# Patient Record
Sex: Female | Born: 1975 | Hispanic: Yes | Marital: Married | Smoking: Never smoker
Health system: Southern US, Community
[De-identification: ages and names within clinical notes are randomized; demographics above are authoritative.]

## PROBLEM LIST (undated history)

## (undated) DIAGNOSIS — R7303 Prediabetes: Secondary | ICD-10-CM

## (undated) DIAGNOSIS — N12 Tubulo-interstitial nephritis, not specified as acute or chronic: Secondary | ICD-10-CM

## (undated) DIAGNOSIS — R569 Unspecified convulsions: Secondary | ICD-10-CM

## (undated) DIAGNOSIS — E039 Hypothyroidism, unspecified: Secondary | ICD-10-CM

## (undated) DIAGNOSIS — Z87442 Personal history of urinary calculi: Secondary | ICD-10-CM

## (undated) DIAGNOSIS — E079 Disorder of thyroid, unspecified: Secondary | ICD-10-CM

## (undated) HISTORY — PX: CARDIAC CATHETERIZATION: SHX172

---

## 2007-06-13 ENCOUNTER — Inpatient Hospital Stay: Payer: Self-pay | Admitting: Obstetrics and Gynecology

## 2012-09-26 DIAGNOSIS — G40309 Generalized idiopathic epilepsy and epileptic syndromes, not intractable, without status epilepticus: Secondary | ICD-10-CM

## 2012-09-26 HISTORY — DX: Generalized idiopathic epilepsy and epileptic syndromes, not intractable, without status epilepticus: G40.309

## 2014-10-27 DIAGNOSIS — N879 Dysplasia of cervix uteri, unspecified: Secondary | ICD-10-CM | POA: Insufficient documentation

## 2014-10-27 HISTORY — DX: Dysplasia of cervix uteri, unspecified: N87.9

## 2017-08-30 ENCOUNTER — Encounter: Payer: Self-pay | Admitting: Emergency Medicine

## 2017-08-30 ENCOUNTER — Emergency Department: Payer: Self-pay

## 2017-08-30 ENCOUNTER — Emergency Department
Admission: EM | Admit: 2017-08-30 | Discharge: 2017-08-30 | Disposition: A | Discharge status: Home / Self Care | Payer: Self-pay | Attending: Emergency Medicine | Admitting: Emergency Medicine

## 2017-08-30 ENCOUNTER — Other Ambulatory Visit: Payer: Self-pay

## 2017-08-30 DIAGNOSIS — N200 Calculus of kidney: Secondary | ICD-10-CM | POA: Insufficient documentation

## 2017-08-30 DIAGNOSIS — M549 Dorsalgia, unspecified: Secondary | ICD-10-CM | POA: Insufficient documentation

## 2017-08-30 HISTORY — DX: Disorder of thyroid, unspecified: E07.9

## 2017-08-30 LAB — URINALYSIS, ROUTINE W REFLEX MICROSCOPIC
Bilirubin Urine: NEGATIVE
Glucose, UA: NEGATIVE mg/dL
Ketones, ur: NEGATIVE mg/dL
NITRITE: NEGATIVE
Protein, ur: NEGATIVE mg/dL
SPECIFIC GRAVITY, URINE: 1.003 — AB (ref 1.005–1.030)
pH: 6 (ref 5.0–8.0)

## 2017-08-30 LAB — CHLAMYDIA/NGC RT PCR (ARMC ONLY)
CHLAMYDIA TR: NOT DETECTED
N GONORRHOEAE: NOT DETECTED

## 2017-08-30 LAB — WET PREP, GENITAL
Clue Cells Wet Prep HPF POC: NONE SEEN
Sperm: NONE SEEN
Trich, Wet Prep: NONE SEEN
Yeast Wet Prep HPF POC: NONE SEEN

## 2017-08-30 LAB — CBC
HEMATOCRIT: 40.3 % (ref 35.0–47.0)
Hemoglobin: 13.6 g/dL (ref 12.0–16.0)
MCH: 30.1 pg (ref 26.0–34.0)
MCHC: 33.7 g/dL (ref 32.0–36.0)
MCV: 89.1 fL (ref 80.0–100.0)
Platelets: 306 10*3/uL (ref 150–440)
RBC: 4.52 MIL/uL (ref 3.80–5.20)
RDW: 13.1 % (ref 11.5–14.5)
WBC: 5.9 10*3/uL (ref 3.6–11.0)

## 2017-08-30 LAB — COMPREHENSIVE METABOLIC PANEL
ALT: 19 U/L (ref 14–54)
AST: 19 U/L (ref 15–41)
Albumin: 4.4 g/dL (ref 3.5–5.0)
Alkaline Phosphatase: 84 U/L (ref 38–126)
Anion gap: 8 (ref 5–15)
BILIRUBIN TOTAL: 0.5 mg/dL (ref 0.3–1.2)
BUN: 12 mg/dL (ref 6–20)
CO2: 24 mmol/L (ref 22–32)
CREATININE: 0.5 mg/dL (ref 0.44–1.00)
Calcium: 8.9 mg/dL (ref 8.9–10.3)
Chloride: 106 mmol/L (ref 101–111)
Glucose, Bld: 82 mg/dL (ref 65–99)
POTASSIUM: 3.6 mmol/L (ref 3.5–5.1)
Sodium: 138 mmol/L (ref 135–145)
TOTAL PROTEIN: 7.8 g/dL (ref 6.5–8.1)

## 2017-08-30 LAB — PREGNANCY, URINE: PREG TEST UR: NEGATIVE

## 2017-08-30 LAB — LIPASE, BLOOD: LIPASE: 37 U/L (ref 11–51)

## 2017-08-30 MED ORDER — IBUPROFEN 600 MG PO TABS
600.0000 mg | ORAL_TABLET | ORAL | Status: AC
  Administered 2017-08-30: 600 mg via ORAL
  Filled 2017-08-30 (×2): qty 1

## 2017-08-30 MED ORDER — ACETAMINOPHEN 500 MG PO TABS
1000.0000 mg | ORAL_TABLET | ORAL | Status: AC
  Administered 2017-08-30: 1000 mg via ORAL

## 2017-08-30 MED ORDER — ONDANSETRON 4 MG PO TBDP: 4.0000 mg | ORAL_TABLET | Freq: Four times a day (QID) | ORAL | 0 refills | Status: DC | PRN

## 2017-08-30 MED ORDER — ACETAMINOPHEN 500 MG PO TABS
ORAL_TABLET | ORAL | Status: AC
  Filled 2017-08-30: qty 2

## 2017-08-30 NOTE — ED Notes (Signed)
Patient transported to CT 

## 2017-08-30 NOTE — ED Provider Notes (Signed)
[facility] Emergency Department Provider Note   ____________________________________________   First MD Initiated Contact with Patient 08/30/17 1443     (approximate)  I have reviewed the triage vital signs and the nursing notes.   HISTORY  Chief Complaint Abdominal Pain and Back Pain  Spanish interpreter, Maryjane Hurter  HPI Jasmine Henderson is a 42 y.o. female presents for evaluation of back pain and left lower abdominal pain  Patient reports since about Tuesday she is been experiencing discomfort, potentially a week or more.  Reports she has been having a feeling of discomfort in the left lower abdomen and left back.  She went to her primary doctor and was placed on antibiotic, believed Bactrim.  She then went to urgent care a few days later and was treated with Cipro, diagnosed with a "UTI".  She returns again with ongoing symptoms which she describes as a discomfort in her left lower abdomen.  Somewhat dull, but radiates to her left back.  She does notice that she has some discomfort in the vaginal area.  Denies fevers or chills.  No nausea or vomiting.  Reports the pain is mild, has not taken any pain medication for it.  Denies pregnancy, reports that she has had slightly increased length and her most recent menstrual cycle which started about a week ago, and she has had slight persistence of that.  Reports it is similar in amount of spotting to that of a normal menstrual cycle.  No chest pain or trouble breathing.  No numbness tingling or weakness.  No fever or chills.  Patient also reports that she was possibly diagnosed with a fungal vaginal infection with her doctor last week and took 2 doses of fluconazole with the last dose on Wednesday.  Past Medical History:  Diagnosis Date  . Thyroid disease     There are no active problems to display for this patient.     Prior to Admission medications   Medication Sig Start Date End Date Taking?  Authorizing Provider  ondansetron (ZOFRAN ODT) 4 MG disintegrating tablet Take 1 tablet (4 mg total) by mouth every 6 (six) hours as needed for nausea or vomiting. 08/30/17   Sharyn Creamer, MD    Allergies Patient has no known allergies.  No family history on file.  Social History Social History   Tobacco Use  . Smoking status: Never Smoker  . Smokeless tobacco: Never Used  Substance Use Topics  . Alcohol use: No    Frequency: Never  . Drug use: Not on file  She does not smoke drink or use drugs  Review of Systems Constitutional: No fever/chills Eyes: No visual changes. ENT: No sore throat. Cardiovascular: Denies chest pain. Respiratory: Denies shortness of breath. Gastrointestinal: See HPI no nausea, no vomiting.  No diarrhea.  No constipation. Genitourinary: Negative for dysuria.  Does report discomfort in the vaginal region but reports this is a chronic discomfort is been ongoing for years as well. Musculoskeletal: Negative for back pain. Skin: Negative for rash. Neurological: Negative for headaches, focal weakness or numbness.    ____________________________________________   PHYSICAL EXAM:  VITAL SIGNS: ED Triage Vitals  Enc Vitals Group     BP 08/30/17 1143 135/84     Pulse Rate 08/30/17 1143 89     Resp --      Temp 08/30/17 1143 98.4 F (36.9 C)     Temp Source 08/30/17 1143 Oral     SpO2 08/30/17 1143 98 %  Weight 08/30/17 1138 155 lb (70.3 kg)     Height 08/30/17 1138 5\' 2"  (1.575 m)     Head Circumference --      Peak Flow --      Pain Score 08/30/17 1144 8     Pain Loc --      Pain Edu? --      Excl. in GC? --     Constitutional: Alert and oriented. Well appearing and in no acute distress. Eyes: Conjunctivae are normal. Head: Atraumatic. Nose: No congestion/rhinnorhea. Mouth/Throat: Mucous membranes are moist. Neck: No stridor.   Cardiovascular: Normal rate, regular rhythm. Grossly normal heart sounds.  Good peripheral  circulation. Respiratory: Normal respiratory effort.  No retractions. Lungs CTAB. Gastrointestinal: Soft and nontender except for mild tenderness reported to deep palpation in the left lower quadrant without rebound or guarding. No distention.  No CVA tenderness bilateral. GU: Form with tech Tammy as well as Spanish interpreter.  External exam normal.  The right adnexa and left adnexa are nontender.  No pain to cervical motion tenderness.  There is a scant whitish discharge noticed within the vagina.  There is no erythema or redness.  No reproducible pain.  No adnexal masses or tenderness. Musculoskeletal: No lower extremity tenderness nor edema. Neurologic:  Normal speech and language. No gross focal neurologic deficits are appreciated.  Skin:  Skin is warm, dry and intact. No rash noted. Psychiatric: Mood and affect are normal. Speech and behavior are normal.  ____________________________________________   LABS (all labs ordered are listed, but only abnormal results are displayed)  Labs Reviewed  WET PREP, GENITAL - Abnormal; Notable for the following components:      Result Value   WBC, Wet Prep HPF POC RARE (*)    All other components within normal limits  URINALYSIS, ROUTINE W REFLEX MICROSCOPIC - Abnormal; Notable for the following components:   Color, Urine STRAW (*)    APPearance HAZY (*)    Specific Gravity, Urine 1.003 (*)    Hgb urine dipstick LARGE (*)    Leukocytes, UA TRACE (*)    Bacteria, UA FEW (*)    Squamous Epithelial / LPF 0-5 (*)    All other components within normal limits  URINE CULTURE  CHLAMYDIA/NGC RT PCR ([facility] ONLY)  CBC  COMPREHENSIVE METABOLIC PANEL  LIPASE, BLOOD  PREGNANCY, URINE   ____________________________________________  EKG   ____________________________________________  RADIOLOGY CT abdomen results reviewed by me 1. 6 x 2 mm left UVJ stone causing mild left-sided hydroureteronephrosis. 2. 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 ____________________________________________   PROCEDURES  Procedure(s) performed: None  Procedures  Critical Care performed: No  ____________________________________________   INITIAL IMPRESSION / ASSESSMENT AND PLAN / ED COURSE  Pertinent labs & imaging results that were available during my care of the patient were reviewed by me and considered in my medical decision making (see chart for details).      Review of previous records, patient had a urine culture on 08/29/2017 that was negative.  Urinalysis done in clinic today does show moderate bacteria but also moderate squamous cells.  Negative for nitrites, small leukocytes.  Not a clear-cut urinary tract infection at this point.  Our evaluation does show white cells, large hemoglobin, though the patient is also reporting her menstrual cycle.  Differential diagnosis is somewhat broad, will check urine pregnancy test, also consider etiology such as kidney stone, renal or urologic etiology, potentially pelvic etiology such as an ovarian cyst rupture, symptoms do not seem  to be consistent with a torsion.  Reassuring examination without peritonitis, and her symptoms have persisted for about a week's time now making torsion seem highly unlikely.  Pelvic exam no acute findings in the slight whitish discharge without abnormal odor or discomfort.  ----------------------------------------- 4:08 PM on 08/30/2017 -----------------------------------------  CT scan reveals left-sided kidney stone, upper margin of size that she may be able to pass on her own.  She demonstrates no evidence of infection, no elevated white count, no fever, urine culture just a couple days ago negative for infection.  Will reculture urine today, but did not feel that a third antibiotic is needed at this time rather we will culture urine as the patient shows no signs of infection.  Discussed with the patient via interpreter, diagnosis of kidney  stones, need for careful follow-up with her primary doctor and neurologist, careful return precautions including fever, vomiting, weakness, severe pain.  Patient does not wish for anything stronger than Tylenol or ibuprofen for pain, will provide ibuprofen here for her before discharge.  Will give prescription for Zofran in the event of nausea and vomiting.  Return precautions and treatment recommendations and follow-up discussed with the patient who is agreeable with the plan.  Maryjane Hurter, Spanish interpreter utilized.   ____________________________________________   FINAL CLINICAL IMPRESSION(S) / ED DIAGNOSES  Final diagnoses:  Kidney stone on left side      NEW MEDICATIONS STARTED DURING THIS VISIT:  New Prescriptions   ONDANSETRON (ZOFRAN ODT) 4 MG DISINTEGRATING TABLET    Take 1 tablet (4 mg total) by mouth every 6 (six) hours as needed for nausea or vomiting.     Note:  This document was prepared using Dragon voice recognition software and may include unintentional dictation errors.     Sharyn Creamer, MD 08/30/17 1610

## 2017-08-30 NOTE — ED Notes (Signed)
Lab notified to add on Urine Pregnancy 

## 2017-08-30 NOTE — ED Notes (Signed)
First Nurse Note:  Patient here from [facility] via [facility].  Seen there for UTI on Tuesday, here today because of no improvement.  Will need Interpreter.

## 2017-08-30 NOTE — Discharge Instructions (Signed)
You have been seen in the Emergency Department (ED) today for pain that we believe based on your workup, is caused by kidney stones.  As we have discussed, please drink plenty of fluids.  Please make a follow up appointment with the physician(s) listed elsewhere in this documentation.  We also recommend that you take over-the-counter ibuprofen regularly according to label instructions over the next 5 days.  Take it with meals to minimize stomach discomfort.  Please see your doctor as soon as possible as stones may take 1-3 weeks to pass and you may require additional care or medications.   Return to the Emergency Department (ED) or call your doctor if you have any worsening pain, fever, painful urination, are unable to urinate, or develop other symptoms that concern you.

## 2017-08-30 NOTE — ED Triage Notes (Signed)
Pt here for lower abd pain and back pain. Pt seen at health dept Friday and given bactrum and diflucan. [facility] and given cipro. Went backto [facility] today because problem is worse and sent here. Has vaginal burning but not urinary burning but has seen blood in urine.

## 2017-08-30 NOTE — ED Notes (Signed)
Pelvic Cart set up at bedside. 

## 2017-08-30 NOTE — ED Notes (Signed)
Spoke with pt about wait times and what to expect next. Advised pt that I am available for further questions if needed.  

## 2017-08-31 LAB — URINE CULTURE
Culture: <10000 — AB
SPECIAL REQUESTS: NORMAL

## 2017-09-02 NOTE — H&P (View-Only) (Signed)
09/03/2017 12:40 PM   Jasmine Henderson 06/18/1975 [id number]  Referring provider: [facility] [address] [place], [place] [id number]  Chief Complaint  Patient presents with  . Nephrolithiasis    HPI: Patient is a 42 year old Hispanic female who presents today for follow up after [facility] nephrolithiasis with interpreter, Orson SlickJacqui.    She presented to ED on 08/30/2017 for abdominal pain.  CT Renal stone study performed on 08/30/2017 noted a 6 x 2 mm left UVJ stone causing mild left-sided hydroureteronephrosis.  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  Her creatinine was 0.50.  Her WBC count was 5.9.  Her UA was positive for 0-5 RBC's and 6-30 WBC's.  Urine culture with insignificant growth.    Today, she is experiencing left lower quadrant pain that is intermittent in nature.  She describes it as a burning sensation.  It has been a 4 out of 10 pain.  She has had associated frequency, nocturia and gross hematuria.  She is also been experiencing some right flank pain for the last week.  It is also been a 4 out of 10 pain.  She describes the pain as a dull ache.  She states this pain is constant.  She states Tylenol helps the pain.  She states movement makes the right flank pain worse.  Patient denies any gross hematuria, dysuria or suprapubic/flank pain.  Patient denies any fevers, chills, nausea or vomiting.   Her UA is positive for 6-10 WBC's, 11-30 RBC's and moderate bacteria.    PMH: Past Medical History:  Diagnosis Date  . Thyroid disease     Surgical History: None  Home Medications:  Allergies as of 09/03/2017   No Known Allergies     Medication List        Accurate as of 09/03/17 12:40 PM. Always use your most recent med list.          levothyroxine 100 MCG tablet Commonly known as:  SYNTHROID, LEVOTHROID Take by mouth.       Allergies: No Known Allergies  Family History: Family  History  Problem Relation Age of Onset  . Bladder Cancer Neg Hx   . Kidney cancer Neg Hx     Social History:  reports that  has never smoked. she has never used smokeless tobacco. She reports that she does not drink alcohol or use drugs.  ROS: UROLOGY Frequent Urination?: Yes Hard to postpone urination?: No Burning/pain with urination?: No Get up at night to urinate?: Yes Leakage of urine?: No Urine stream starts and stops?: No Trouble starting stream?: No Do you have to strain to urinate?: No Blood in urine?: Yes Urinary tract infection?: Yes Sexually transmitted disease?: No Injury to kidneys or bladder?: No Painful intercourse?: No Weak stream?: No Currently pregnant?: No Vaginal bleeding?: No Last menstrual period?: 08/07/2017  Gastrointestinal Nausea?: No Vomiting?: No Indigestion/heartburn?: No Diarrhea?: No Constipation?: No  Constitutional Fever: No Night sweats?: No Weight loss?: No Fatigue?: Yes  Skin Skin rash/lesions?: Yes Itching?: Yes  Eyes Blurred vision?: Yes Double vision?: No  Ears/Nose/Throat Sore throat?: No Sinus problems?: No  Hematologic/Lymphatic Swollen glands?: No Easy bruising?: No  Cardiovascular Leg swelling?: No Chest pain?: No  Respiratory Cough?: No Shortness of breath?: No  Endocrine Excessive thirst?: No  Musculoskeletal Back pain?: Yes Joint pain?: No  Neurological Headaches?: No Dizziness?: No  Psychologic Depression?: No Anxiety?: No  Physical Exam: BP 131/85   Pulse  77   Resp 16   Ht 5\' 2"  (1.575 m)   Wt 156 lb 3.2 oz (70.9 kg)   LMP 08/07/2017 Comment: neg preg test today  SpO2 98%   BMI 28.57 kg/m   Constitutional: Well nourished. Alert and oriented, No acute distress. HEENT: [place] AT, moist mucus membranes. Trachea midline, no masses. Cardiovascular: No clubbing, cyanosis, or edema. Respiratory: Normal respiratory effort, no increased work of breathing. GI: Abdomen is soft, non tender,  non distended, no abdominal masses. Liver and spleen not palpable.  No hernias appreciated.  Stool sample for occult testing is not indicated.   GU: No CVA tenderness.  No bladder fullness or masses.   Skin: No rashes, bruises or suspicious lesions. Lymph: No cervical or inguinal adenopathy. Neurologic: Grossly intact, no focal deficits, moving all 4 extremities. Psychiatric: Normal mood and affect.  Laboratory Data: Lab Results  Component Value Date   WBC 5.9 08/30/2017   HGB 13.6 08/30/2017   HCT 40.3 08/30/2017   MCV 89.1 08/30/2017   PLT 306 08/30/2017    Lab Results  Component Value Date   CREATININE 0.50 08/30/2017    No results found for: PSA  No results found for: TESTOSTERONE  No results found for: HGBA1C  No results found for: TSH  No results found for: CHOL, HDL, CHOLHDL, VLDL, LDLCALC  Lab Results  Component Value Date   AST 19 08/30/2017   Lab Results  Component Value Date   ALT 19 08/30/2017   No components found for: ALKALINEPHOPHATASE No components found for: BILIRUBINTOTAL  No results found for: ESTRADIOL  Urinalysis 6-10 WBC's.  11-30 RBC's.  Many bacteria.  See Epic.   I have reviewed the labs.   Pertinent Imaging: CLINICAL DATA:  Lower back and abdominal pain x2 weeks.  EXAM: CT ABDOMEN AND PELVIS WITHOUT CONTRAST  TECHNIQUE: Multidetector CT imaging of the abdomen and pelvis was performed following the standard protocol without IV contrast.  COMPARISON:  None.  FINDINGS: Lower chest: Normal size heart. Minimal dependent atelectasis at the lung bases. No acute abnormality.  Hepatobiliary: No focal liver abnormality is seen. No gallstones, gallbladder wall thickening, or biliary dilatation.  Pancreas: Normal  Spleen: Normal  Adrenals/Urinary Tract: Normal bilateral adrenal glands. Large staghorn calculus occupying the intrarenal collecting system and right renal pelvis. No distal uropathy is noted. A 6 x 2 mm left  UVJ stone is identified causing mild left-sided hydroureteronephrosis. The urinary bladder is decompressed which may account for the slightly thick-walled appearance.  Stomach/Bowel: Stomach is within normal limits. Appendix appears normal. No evidence of bowel wall thickening, distention, or inflammatory changes.  Vascular/Lymphatic: No significant vascular findings are present. No enlarged abdominal or pelvic lymph nodes.  Reproductive: Uterus and bilateral adnexa are unremarkable.  Other: No abdominal wall hernia or abnormality. No abdominopelvic ascites.  Musculoskeletal: No acute or significant osseous findings.  IMPRESSION: 1. 6 x 2 mm left UVJ stone causing mild left-sided hydroureteronephrosis. 2. 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   Electronically Signed   By: Tollie Eth M.D.   On: 08/30/2017 15:50 I have independently reviewed the films with Dr. Apolinar Junes.   Assessment & Plan:    Patient with a thickened wall of the bladder associated with a left UVJ stone contained within a ureterocele and hydronephrosis will be undergoing cystoscopy, ureteroscopy with laser lithotripsy and ureteral stent placement for definitive management.  The right staghorn kidney stone will be addressed once she has recovered from the left-sided procedure.  1. Left ureteral stone  - left ureteral stone contained in an ureterocele  - schedule left ureteroscopy with laser lithotripsy and ureteral stent placement  - explained to the patient how the procedure is performed and the risks involved  - informed patient that they will have a stent placed during the procedure and will remain in place after the procedure for a short time.   - stent may be removed in the office with a cystoscope or patient may be instructed to remove the stent themselves by the string  - described "stent pain" as feelings of needing to urinate/overactive bladder and a warm, tingling sensation to  intense pain in the affected flank  - residual stones within the kidney or ureter may be present after the procedure and may need to have these addressed at a different encounter  - injury to the ureter is the most common intra-operative risk, it may result in an open procedure to correct the defect  - infection and bleeding are also risks  - explained the risks of general anesthesia, such as: MI, CVA, paralysis, coma and/or death.  - advised to contact our office or seek treatment in the ED if becomes febrile or pain/ vomiting are difficult control in order to arrange for emergent/urgent intervention  2. Left hydronephrosis  - obtain RUS to ensure the hydronephrosis has resolved once the left ureteral stone has been treated  3. Thicken bladder wall Scheduled for cystoscopy at the time of left URS/LL/ureteral stent placement  4. Left ureterocele Scheduled for left URS/LL/ureteral stent placement  4. Microscopic hematuria  - UA today demonstrates 11-30 RBC's  - continue to monitor the patient's UA after the treatment/passage of the stone to ensure the hematuria has resolved  - if hematuria persists, we will pursue a hematuria workup with CT Urogram and cystoscopy if appropriate.  5. Right staghorn calculus Will need to be addressed once recovered from left ureteral procedure - explained to the patient that this will require staged procedures  Return for cystoscopy with left URS/LL/ureteral stent placement.  These notes generated with voice recognition software. I apologize for typographical errors.  Michiel Cowboy, PA-C  [facility] [address] [place], [place] [address] [phone]

## 2017-09-02 NOTE — Progress Notes (Signed)
09/03/2017 12:40 PM   Jasmine Henderson 06/18/1975 [id number]  Referring provider: [facility] [address] [place], [place] [address]  Chief Complaint  Patient presents with  . Nephrolithiasis    HPI: Patient is a 42 year old Hispanic female who presents today for follow up after being [facility] for nephrolithiasis with interpreter, Orson SlickJacqui.    She presented to ED on 08/30/2017 for abdominal pain.  CT Renal stone study performed on 08/30/2017 noted a 6 x 2 mm left UVJ stone causing mild left-sided hydroureteronephrosis.  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  Her creatinine was 0.50.  Her WBC count was 5.9.  Her UA was positive for 0-5 RBC's and 6-30 WBC's.  Urine culture with insignificant growth.    Today, she is experiencing left lower quadrant pain that is intermittent in nature.  She describes it as a burning sensation.  It has been a 4 out of 10 pain.  She has had associated frequency, nocturia and gross hematuria.  She is also been experiencing some right flank pain for the last week.  It is also been a 4 out of 10 pain.  She describes the pain as a dull ache.  She states this pain is constant.  She states Tylenol helps the pain.  She states movement makes the right flank pain worse.  Patient denies any gross hematuria, dysuria or suprapubic/flank pain.  Patient denies any fevers, chills, nausea or vomiting.   Her UA is positive for 6-10 WBC's, 11-30 RBC's and moderate bacteria.    PMH: Past Medical History:  Diagnosis Date  . Thyroid disease     Surgical History: None  Home Medications:  Allergies as of 09/03/2017   No Known Allergies     Medication List        Accurate as of 09/03/17 12:40 PM. Always use your most recent med list.          levothyroxine 100 MCG tablet Commonly known as:  SYNTHROID, LEVOTHROID Take by mouth.       Allergies: No Known Allergies  Family History: Family  History  Problem Relation Age of Onset  . Bladder Cancer Neg Hx   . Kidney cancer Neg Hx     Social History:  reports that  has never smoked. she has never used smokeless tobacco. She reports that she does not drink alcohol or use drugs.  ROS: UROLOGY Frequent Urination?: Yes Hard to postpone urination?: No Burning/pain with urination?: No Get up at night to urinate?: Yes Leakage of urine?: No Urine stream starts and stops?: No Trouble starting stream?: No Do you have to strain to urinate?: No Blood in urine?: Yes Urinary tract infection?: Yes Sexually transmitted disease?: No Injury to kidneys or bladder?: No Painful intercourse?: No Weak stream?: No Currently pregnant?: No Vaginal bleeding?: No Last menstrual period?: 08/07/2017  Gastrointestinal Nausea?: No Vomiting?: No Indigestion/heartburn?: No Diarrhea?: No Constipation?: No  Constitutional Fever: No Night sweats?: No Weight loss?: No Fatigue?: Yes  Skin Skin rash/lesions?: Yes Itching?: Yes  Eyes Blurred vision?: Yes Double vision?: No  Ears/Nose/Throat Sore throat?: No Sinus problems?: No  Hematologic/Lymphatic Swollen glands?: No Easy bruising?: No  Cardiovascular Leg swelling?: No Chest pain?: No  Respiratory Cough?: No Shortness of breath?: No  Endocrine Excessive thirst?: No  Musculoskeletal Back pain?: Yes Joint pain?: No  Neurological Headaches?: No Dizziness?: No  Psychologic Depression?: No Anxiety?: No  Physical Exam: BP 131/85   Pulse  77   Resp 16   Ht 5\' 2"  (1.575 m)   Wt 156 lb 3.2 oz (70.9 kg)   LMP 08/07/2017 Comment: neg preg test today  SpO2 98%   BMI 28.57 kg/m   Constitutional: Well nourished. Alert and oriented, No acute distress. HEENT: [place] AT, moist mucus membranes. Trachea midline, no masses. Cardiovascular: No clubbing, cyanosis, or edema. Respiratory: Normal respiratory effort, no increased work of breathing. GI: Abdomen is soft, non tender,  non distended, no abdominal masses. Liver and spleen not palpable.  No hernias appreciated.  Stool sample for occult testing is not indicated.   GU: No CVA tenderness.  No bladder fullness or masses.   Skin: No rashes, bruises or suspicious lesions. Lymph: No cervical or inguinal adenopathy. Neurologic: Grossly intact, no focal deficits, moving all 4 extremities. Psychiatric: Normal mood and affect.  Laboratory Data: Lab Results  Component Value Date   WBC 5.9 08/30/2017   HGB 13.6 08/30/2017   HCT 40.3 08/30/2017   MCV 89.1 08/30/2017   PLT 306 08/30/2017    Lab Results  Component Value Date   CREATININE 0.50 08/30/2017    No results found for: PSA  No results found for: TESTOSTERONE  No results found for: HGBA1C  No results found for: TSH  No results found for: CHOL, HDL, CHOLHDL, VLDL, LDLCALC  Lab Results  Component Value Date   AST 19 08/30/2017   Lab Results  Component Value Date   ALT 19 08/30/2017   No components found for: ALKALINEPHOPHATASE No components found for: BILIRUBINTOTAL  No results found for: ESTRADIOL  Urinalysis 6-10 WBC's.  11-30 RBC's.  Many bacteria.  See Epic.   I have reviewed the labs.   Pertinent Imaging: CLINICAL DATA:  Lower back and abdominal pain x2 weeks.  EXAM: CT ABDOMEN AND PELVIS WITHOUT CONTRAST  TECHNIQUE: Multidetector CT imaging of the abdomen and pelvis was performed following the standard protocol without IV contrast.  COMPARISON:  None.  FINDINGS: Lower chest: Normal size heart. Minimal dependent atelectasis at the lung bases. No acute abnormality.  Hepatobiliary: No focal liver abnormality is seen. No gallstones, gallbladder wall thickening, or biliary dilatation.  Pancreas: Normal  Spleen: Normal  Adrenals/Urinary Tract: Normal bilateral adrenal glands. Large staghorn calculus occupying the intrarenal collecting system and right renal pelvis. No distal uropathy is noted. A 6 x 2 mm left  UVJ stone is identified causing mild left-sided hydroureteronephrosis. The urinary bladder is decompressed which may account for the slightly thick-walled appearance.  Stomach/Bowel: Stomach is within normal limits. Appendix appears normal. No evidence of bowel wall thickening, distention, or inflammatory changes.  Vascular/Lymphatic: No significant vascular findings are present. No enlarged abdominal or pelvic lymph nodes.  Reproductive: Uterus and bilateral adnexa are unremarkable.  Other: No abdominal wall hernia or abnormality. No abdominopelvic ascites.  Musculoskeletal: No acute or significant osseous findings.  IMPRESSION: 1. 6 x 2 mm left UVJ stone causing mild left-sided hydroureteronephrosis. 2. 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   Electronically Signed   By: Tollie Eth M.D.   On: 08/30/2017 15:50 I have independently reviewed the films with Dr. Apolinar Junes.   Assessment & Plan:    Patient with a thickened wall of the bladder associated with a left UVJ stone contained within a ureterocele and hydronephrosis will be undergoing cystoscopy, ureteroscopy with laser lithotripsy and ureteral stent placement for definitive management.  The right staghorn kidney stone will be addressed once she has recovered from the left-sided procedure.  1. Left ureteral stone  - left ureteral stone contained in an ureterocele  - schedule left ureteroscopy with laser lithotripsy and ureteral stent placement  - explained to the patient how the procedure is performed and the risks involved  - informed patient that they will have a stent placed during the procedure and will remain in place after the procedure for a short time.   - stent may be removed in the office with a cystoscope or patient may be instructed to remove the stent themselves by the string  - described "stent pain" as feelings of needing to urinate/overactive bladder and a warm, tingling sensation to  intense pain in the affected flank  - residual stones within the kidney or ureter may be present after the procedure and may need to have these addressed at a different encounter  - injury to the ureter is the most common intra-operative risk, it may result in an open procedure to correct the defect  - infection and bleeding are also risks  - explained the risks of general anesthesia, such as: MI, CVA, paralysis, coma and/or death.  - advised to contact our office or seek treatment in the ED if becomes febrile or pain/ vomiting are difficult control in order to arrange for emergent/urgent intervention  2. Left hydronephrosis  - obtain RUS to ensure the hydronephrosis has resolved once the left ureteral stone has been treated  3. Thicken bladder wall Scheduled for cystoscopy at the time of left URS/LL/ureteral stent placement  4. Left ureterocele Scheduled for left URS/LL/ureteral stent placement  4. Microscopic hematuria  - UA today demonstrates 11-30 RBC's  - continue to monitor the patient's UA after the treatment/passage of the stone to ensure the hematuria has resolved  - if hematuria persists, we will pursue a hematuria workup with CT Urogram and cystoscopy if appropriate.  5. Right staghorn calculus Will need to be addressed once recovered from left ureteral procedure - explained to the patient that this will require staged procedures  Return for cystoscopy with left URS/LL/ureteral stent placement.  These notes generated with voice recognition software. I apologize for typographical errors.  Michiel Cowboy, PA-C  [facility] [address] [place], [place] [address] [phone]

## 2017-09-03 ENCOUNTER — Ambulatory Visit (INDEPENDENT_AMBULATORY_CARE_PROVIDER_SITE_OTHER): Payer: Self-pay | Admitting: Urology

## 2017-09-03 ENCOUNTER — Encounter: Payer: Self-pay | Admitting: Urology

## 2017-09-03 ENCOUNTER — Other Ambulatory Visit: Payer: Self-pay | Admitting: Radiology

## 2017-09-03 VITALS — BP 131/85 | HR 77 | Resp 16 | Ht 62.0 in | Wt 156.2 lb

## 2017-09-03 DIAGNOSIS — N200 Calculus of kidney: Secondary | ICD-10-CM

## 2017-09-03 DIAGNOSIS — N201 Calculus of ureter: Secondary | ICD-10-CM

## 2017-09-03 DIAGNOSIS — N132 Hydronephrosis with renal and ureteral calculous obstruction: Secondary | ICD-10-CM

## 2017-09-03 DIAGNOSIS — E039 Hypothyroidism, unspecified: Secondary | ICD-10-CM | POA: Insufficient documentation

## 2017-09-03 DIAGNOSIS — R3129 Other microscopic hematuria: Secondary | ICD-10-CM

## 2017-09-03 LAB — URINALYSIS, COMPLETE
BILIRUBIN UA: NEGATIVE
GLUCOSE, UA: NEGATIVE
KETONES UA: NEGATIVE
NITRITE UA: NEGATIVE
Protein, UA: NEGATIVE
SPEC GRAV UA: 1.02 (ref 1.005–1.030)
UUROB: 0.2 mg/dL (ref 0.2–1.0)
pH, UA: 7 (ref 5.0–7.5)

## 2017-09-03 LAB — MICROSCOPIC EXAMINATION

## 2017-09-06 LAB — CULTURE, URINE COMPREHENSIVE

## 2017-09-17 ENCOUNTER — Other Ambulatory Visit: Payer: Self-pay

## 2017-09-17 ENCOUNTER — Encounter
Admission: RE | Admit: 2017-09-17 | Discharge: 2017-09-17 | Disposition: A | Discharge status: Home / Self Care | Payer: Self-pay | Source: Ambulatory Visit | Attending: Urology | Admitting: Urology

## 2017-09-17 DIAGNOSIS — Z01818 Encounter for other preprocedural examination: Secondary | ICD-10-CM | POA: Insufficient documentation

## 2017-09-17 DIAGNOSIS — N132 Hydronephrosis with renal and ureteral calculous obstruction: Secondary | ICD-10-CM | POA: Insufficient documentation

## 2017-09-17 HISTORY — DX: Personal history of urinary calculi: Z87.442

## 2017-09-17 HISTORY — DX: Hypothyroidism, unspecified: E03.9

## 2017-09-17 HISTORY — DX: Prediabetes: R73.03

## 2017-09-17 LAB — URINALYSIS, ROUTINE W REFLEX MICROSCOPIC
Bilirubin Urine: NEGATIVE
Glucose, UA: NEGATIVE mg/dL
Ketones, ur: NEGATIVE mg/dL
Nitrite: NEGATIVE
PROTEIN: NEGATIVE mg/dL
Specific Gravity, Urine: 1.004 — ABNORMAL LOW (ref 1.005–1.030)
pH: 6 (ref 5.0–8.0)

## 2017-09-17 LAB — BASIC METABOLIC PANEL
Anion gap: 9 (ref 5–15)
BUN: 11 mg/dL (ref 6–20)
CALCIUM: 9.5 mg/dL (ref 8.9–10.3)
CO2: 28 mmol/L (ref 22–32)
Chloride: 104 mmol/L (ref 101–111)
Creatinine, Ser: 0.55 mg/dL (ref 0.44–1.00)
GFR calc Af Amer: >60 mL/min (ref >60)
GLUCOSE: 95 mg/dL (ref 65–99)
Potassium: 3.4 mmol/L — ABNORMAL LOW (ref 3.5–5.1)
Sodium: 141 mmol/L (ref 135–145)

## 2017-09-17 LAB — CBC
HCT: 42 % (ref 35.0–47.0)
Hemoglobin: 14 g/dL (ref 12.0–16.0)
MCH: 30.1 pg (ref 26.0–34.0)
MCHC: 33.5 g/dL (ref 32.0–36.0)
MCV: 89.9 fL (ref 80.0–100.0)
PLATELETS: 314 10*3/uL (ref 150–440)
RBC: 4.67 MIL/uL (ref 3.80–5.20)
RDW: 13.3 % (ref 11.5–14.5)
WBC: 5.9 10*3/uL (ref 3.6–11.0)

## 2017-09-17 NOTE — Patient Instructions (Addendum)
Your procedure is scheduled on: Tuesday 09/24/17 Su procedimiento est programado para: Report to  Presntese a: To find out your arrival time please call [phone] between 1PM - 3PM on Monday 09/23/17. Para saber su hora de llegada por favor llame al [phone] entre la 1PM - 3PM el da:  Remember: Instructions that are not followed completely may result in serious medical risk, up to and including death, or upon the discretion of your surgeon and anesthesiologist your surgery may need to be rescheduled.  Recuerde: Las instrucciones que no se siguen completamente Armed forces logistics/support/administrative officer en un riesgo de salud grave, incluyendo hasta la [place] o a discrecin de su cirujano y Scientific laboratory technician, su ciruga se puede posponer.   __X__ 1. Do not eat food or drink liquids after midnight. No gum chewing or hard candies.  No coma alimentos ni tome lquidos despus de la medianoche.  No mastique chicle ni caramelos  duros.     __X__ 2. No alcohol for 24 hours before or after surgery.    No tome alcohol durante las 24 horas antes ni despus de la [place].   ____ 3. Bring all medications with you on the day of surgery if instructed.    Lleve todos los medicamentos con usted el da de su ciruga si se le ha indicado as.   __X__ 4. Notify your doctor if there is any change in your medical condition (cold, fever,                             infections).    Informe a su mdico si hay algn cambio en su condicin mdica (resfriado, fiebre, infecciones).   Do not wear jewelry, make-up, hairpins, clips or nail polish.  No use joyas, maquillajes, pinzas/ganchos para el cabello ni esmalte de uas.  Do not wear lotions, powders, or perfumes. .  No use lociones, polvos o perfumes.  .    Do not shave 48 hours prior to surgery. Men may shave face and neck.  No se afeite 48 horas antes de la [place].  Los hombres pueden [employer] cara y el cuello.   Do not bring valuables to the hospital.   No lleve objetos de  valor al hospital.  [facility] is not responsible for any belongings or valuables.  [place] no se hace responsable de ningn tipo de pertenencias u objetos de Licensed conveyancer.               Contacts, dentures or bridgework may not be worn into surgery.  Los lentes de [place], las dentaduras postizas o puentes no se pueden usar en la [place].  Leave your suitcase in the car. After surgery it may be brought to your room.  Deje su maleta en el auto.  Despus de la ciruga podr traerla a su habitacin.  For patients admitted to the hospital, discharge time is determined by your treatment team.  Para los pacientes que sean ingresados al hospital, el tiempo en el cual se le dar de alta es determinado por su                equipo de [place].   Patients discharged the day of surgery will not be allowed to drive home. A los pacientes que se les da de alta el mismo da de la ciruga no se les permitir conducir a Higher education careers adviser.   Please read over the following fact sheets that you were given: Por favor lea  las siguientes hojas de informacin que le dieron:   MRSA Information   __X__ Take these medicines the morning of surgery with A SIP OF WATER:          Johnson & Johnsonome estas medicinas la maana de la ciruga con UN SORBO DE AGUA:  1. LEVOTHYROXINE  2.   3.   4.       5.  6.  ____ Fleet Enema (as directed)          Enema de Fleet (segn lo indicado)    ____ Use CHG Soap as directed          Utilice el jabn de CHG segn lo indicado  ____ Use inhalers on the day of surgery          Use los inhaladores el da de la ciruga  ____ Stop metformin 2 days prior to surgery          Deje de tomar el metformin 2 das antes de la ciruga    ____ Take 1/2 of usual insulin dose the night before surgery and none on the morning of surgery           Tome la mitad de la dosis habitual de insulina la noche antes de la [place] y no tome nada en la maana de la             ciruga  ____ Stop Coumadin/Plavix/aspirin on            Deje de tomar el Coumadin/Plavix/aspirina el da:  __X__ Stop Anti-inflammatories on TODAY          Deje de tomar antiinflamatorios el da:   ____ Stop supplements until after surgery            Deje de tomar suplementos hasta despus de la ciruga  ____ Bring C-Pap to the hospital          Lleve el C-Pap al hospital

## 2017-09-18 LAB — URINE CULTURE: Culture: NO GROWTH

## 2017-09-18 NOTE — Pre-Procedure Instructions (Signed)
Faxed ua to surgeon 

## 2017-09-24 ENCOUNTER — Ambulatory Visit: Payer: Self-pay | Admitting: Anesthesiology

## 2017-09-24 ENCOUNTER — Ambulatory Visit
Admission: RE | Admit: 2017-09-24 | Discharge: 2017-09-24 | Disposition: A | Discharge status: Home / Self Care | Payer: Self-pay | Source: Ambulatory Visit | Attending: Urology | Admitting: Urology

## 2017-09-24 ENCOUNTER — Encounter
Admission: RE | Disposition: A | Discharge status: Home / Self Care | Payer: Self-pay | Source: Ambulatory Visit | Attending: Urology

## 2017-09-24 ENCOUNTER — Other Ambulatory Visit: Payer: Self-pay

## 2017-09-24 ENCOUNTER — Encounter: Payer: Self-pay | Admitting: *Deleted

## 2017-09-24 DIAGNOSIS — Z87442 Personal history of urinary calculi: Secondary | ICD-10-CM | POA: Insufficient documentation

## 2017-09-24 DIAGNOSIS — N2889 Other specified disorders of kidney and ureter: Secondary | ICD-10-CM | POA: Insufficient documentation

## 2017-09-24 DIAGNOSIS — N201 Calculus of ureter: Secondary | ICD-10-CM

## 2017-09-24 DIAGNOSIS — N3281 Overactive bladder: Secondary | ICD-10-CM | POA: Insufficient documentation

## 2017-09-24 DIAGNOSIS — K219 Gastro-esophageal reflux disease without esophagitis: Secondary | ICD-10-CM | POA: Insufficient documentation

## 2017-09-24 DIAGNOSIS — E039 Hypothyroidism, unspecified: Secondary | ICD-10-CM | POA: Insufficient documentation

## 2017-09-24 DIAGNOSIS — Z711 Person with feared health complaint in whom no diagnosis is made: Secondary | ICD-10-CM

## 2017-09-24 HISTORY — PX: CYSTOSCOPY/URETEROSCOPY/HOLMIUM LASER/STENT PLACEMENT: SHX6546

## 2017-09-24 LAB — POCT PREGNANCY, URINE: Preg Test, Ur: NEGATIVE

## 2017-09-24 SURGERY — CYSTOSCOPY/URETEROSCOPY/HOLMIUM LASER/STENT PLACEMENT
Anesthesia: General | Laterality: Left

## 2017-09-24 MED ORDER — PROMETHAZINE HCL 25 MG/ML IJ SOLN: 6.2500 mg | INTRAMUSCULAR | Status: DC | PRN

## 2017-09-24 MED ORDER — CEFAZOLIN SODIUM-DEXTROSE 2-4 GM/100ML-% IV SOLN
2.0000 g | INTRAVENOUS | Status: AC
  Administered 2017-09-24: 2 g via INTRAVENOUS
  Filled 2017-09-24: qty 100

## 2017-09-24 MED ORDER — CEFAZOLIN SODIUM-DEXTROSE 2-3 GM-%(50ML) IV SOLR
INTRAVENOUS | Status: AC
  Filled 2017-09-24: qty 50

## 2017-09-24 MED ORDER — LACTATED RINGERS IV SOLN
INTRAVENOUS | Status: DC
  Administered 2017-09-24: 10:00:00 via INTRAVENOUS

## 2017-09-24 MED ORDER — DEXAMETHASONE SODIUM PHOSPHATE 10 MG/ML IJ SOLN
INTRAMUSCULAR | Status: DC | PRN
  Administered 2017-09-24: 5 mg via INTRAVENOUS

## 2017-09-24 MED ORDER — FAMOTIDINE 20 MG PO TABS
20.0000 mg | ORAL_TABLET | Freq: Once | ORAL | Status: AC
  Administered 2017-09-24: 20 mg via ORAL

## 2017-09-24 MED ORDER — ONDANSETRON HCL 4 MG/2ML IJ SOLN
INTRAMUSCULAR | Status: DC | PRN
  Administered 2017-09-24: 4 mg via INTRAVENOUS

## 2017-09-24 MED ORDER — PHENYLEPHRINE HCL 10 MG/ML IJ SOLN
INTRAMUSCULAR | Status: DC | PRN
  Administered 2017-09-24 (×4): 100 ug via INTRAVENOUS

## 2017-09-24 MED ORDER — HYDROCODONE-ACETAMINOPHEN 5-325 MG PO TABS: 1.0000 | ORAL_TABLET | ORAL | 0 refills | Status: DC | PRN

## 2017-09-24 MED ORDER — FENTANYL CITRATE (PF) 100 MCG/2ML IJ SOLN
INTRAMUSCULAR | Status: DC | PRN
  Administered 2017-09-24: 100 ug via INTRAVENOUS

## 2017-09-24 MED ORDER — HYDROCODONE-ACETAMINOPHEN 7.5-325 MG PO TABS
1.0000 | ORAL_TABLET | Freq: Once | ORAL | Status: DC | PRN
  Filled 2017-09-24: qty 1

## 2017-09-24 MED ORDER — PROPOFOL 10 MG/ML IV BOLUS
INTRAVENOUS | Status: DC | PRN
  Administered 2017-09-24 (×2): 150 mg via INTRAVENOUS

## 2017-09-24 MED ORDER — PROPOFOL 10 MG/ML IV BOLUS
INTRAVENOUS | Status: AC
  Filled 2017-09-24: qty 20

## 2017-09-24 MED ORDER — MIDAZOLAM HCL 2 MG/2ML IJ SOLN
INTRAMUSCULAR | Status: DC | PRN
  Administered 2017-09-24: 2 mg via INTRAVENOUS

## 2017-09-24 MED ORDER — LIDOCAINE HCL (CARDIAC) 20 MG/ML IV SOLN
INTRAVENOUS | Status: DC | PRN
  Administered 2017-09-24: 60 mg via INTRAVENOUS

## 2017-09-24 MED ORDER — FENTANYL CITRATE (PF) 100 MCG/2ML IJ SOLN: 25.0000 ug | INTRAMUSCULAR | Status: DC | PRN

## 2017-09-24 MED ORDER — IOTHALAMATE MEGLUMINE 43 % IV SOLN
INTRAVENOUS | Status: DC | PRN
  Administered 2017-09-24: 12 mL

## 2017-09-24 MED ORDER — FENTANYL CITRATE (PF) 100 MCG/2ML IJ SOLN
INTRAMUSCULAR | Status: AC
  Filled 2017-09-24: qty 2

## 2017-09-24 MED ORDER — MIDAZOLAM HCL 2 MG/2ML IJ SOLN
INTRAMUSCULAR | Status: AC
  Filled 2017-09-24: qty 2

## 2017-09-24 MED ORDER — MEPERIDINE HCL 50 MG/ML IJ SOLN: 6.2500 mg | INTRAMUSCULAR | Status: DC | PRN

## 2017-09-24 MED ORDER — FAMOTIDINE 20 MG PO TABS
ORAL_TABLET | ORAL | Status: AC
  Administered 2017-09-24: 20 mg via ORAL
  Filled 2017-09-24: qty 1

## 2017-09-24 MED ORDER — HYDROMORPHONE HCL 1 MG/ML IJ SOLN
INTRAMUSCULAR | Status: AC
  Filled 2017-09-24: qty 1

## 2017-09-24 MED ORDER — EPHEDRINE SULFATE 50 MG/ML IJ SOLN
INTRAMUSCULAR | Status: DC | PRN
  Administered 2017-09-24 (×3): 5 mg via INTRAVENOUS

## 2017-09-24 SURGICAL SUPPLY — 29 items
BAG DRAIN CYSTO-URO LG1000N (MISCELLANEOUS) ×2 IMPLANT
BASKET ZERO TIP 1.9FR (BASKET) IMPLANT
BRUSH SCRUB EZ 1% IODOPHOR (MISCELLANEOUS) ×2 IMPLANT
CATH URETL 5X70 OPEN END (CATHETERS) ×2 IMPLANT
CNTNR SPEC 2.5X3XGRAD LEK (MISCELLANEOUS)
CONRAY 43 FOR UROLOGY 50M (MISCELLANEOUS) ×2 IMPLANT
CONT SPEC 4OZ STER OR WHT (MISCELLANEOUS)
CONTAINER SPEC 2.5X3XGRAD LEK (MISCELLANEOUS) IMPLANT
DRAPE UTILITY 15X26 TOWEL STRL (DRAPES) ×2 IMPLANT
FIBER LASER LITHO 273 (Laser) IMPLANT
GLOVE BIO SURGEON STRL SZ8 (GLOVE) ×2 IMPLANT
GOWN STRL REUS W/ TWL LRG LVL3 (GOWN DISPOSABLE) ×2 IMPLANT
GOWN STRL REUS W/TWL LRG LVL3 (GOWN DISPOSABLE) ×2
GUIDEWIRE GREEN .038 145CM (MISCELLANEOUS) IMPLANT
GUIDEWIRE STR ZIPWIRE 025X150 (MISCELLANEOUS) ×2 IMPLANT
INFUSOR MANOMETER BAG 3000ML (MISCELLANEOUS) ×2 IMPLANT
INTRODUCER DILATOR DOUBLE (INTRODUCER) IMPLANT
KIT TURNOVER CYSTO (KITS) ×2 IMPLANT
PACK CYSTO AR (MISCELLANEOUS) ×2 IMPLANT
SENSORWIRE 0.038 NOT ANGLED (WIRE) ×4
SET CYSTO W/LG BORE CLAMP LF (SET/KITS/TRAYS/PACK) ×2 IMPLANT
SHEATH URETERAL 12FRX35CM (MISCELLANEOUS) IMPLANT
SOL .9 NS 3000ML IRR  AL (IV SOLUTION) ×1
SOL .9 NS 3000ML IRR UROMATIC (IV SOLUTION) ×1 IMPLANT
STENT URET 6FRX24 CONTOUR (STENTS) IMPLANT
STENT URET 6FRX26 CONTOUR (STENTS) IMPLANT
SURGILUBE 2OZ TUBE FLIPTOP (MISCELLANEOUS) ×2 IMPLANT
WATER STERILE IRR 1000ML POUR (IV SOLUTION) ×2 IMPLANT
WIRE SENSOR 0.038 NOT ANGLED (WIRE) ×2 IMPLANT

## 2017-09-24 NOTE — Anesthesia Post-op Follow-up Note (Cosign Needed)
Anesthesia QCDR form completed.        

## 2017-09-24 NOTE — Progress Notes (Signed)
Sprite given to patient. Tolerated wnl.

## 2017-09-24 NOTE — Op Note (Signed)
Preoperative diagnosis: Left distal ureteral calculus  Postoperative diagnosis: History of left ureteral calculus  Procedure:  1. Cystoscopy 2. Left ureteroscopy 3. Left retrograde pyelography with interpretation  Surgeon: Lorin PicketScott C. Kaitlynne Wenz, M.D.  Anesthesia: General  Complications: None  Intraoperative findings:  1.  Left retrograde pyelography demonstrated no filling defect or calculus within the left ureter.  A nonobstructive left ureterocele was identified.  There was prompt emptying of contrast under real-time fluoroscopy and a ureteral stent was not placed.   EBL: Minimal  Specimens: 1. None   Indication: Jasmine Henderson is a 42 y.o. year old patient recently seen for a large right staghorn calculus and a 2 x 6 mm left UVJ calculus with mild hydronephrosis.  She is scheduled for left ureteroscopic stone removal prior to treatment of her contralateral staghorn calculus.  She is not aware of passing a stone.  After reviewing the management options for treatment, the patient elected to proceed with the above surgical procedure(s). We have discussed the potential benefits and risks of the procedure, side effects of the proposed treatment, the likelihood of the patient achieving the goals of the procedure, and any potential problems that might occur during the procedure or recuperation. Informed consent has been obtained.  Description of procedure:  The patient was taken to the operating room and general anesthesia was induced.  The patient was placed in the dorsal lithotomy position, prepped and draped in the usual sterile fashion, and preoperative antibiotics were administered. A preoperative time-out was performed.   A 22 French cystoscope was lubricated and passed under direct vision.  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  A left ureterocele was present  effluxing clear urine.  Attention was directed to the left ureteral orifice and a 0.038 Sensor wire was then advanced up the left ureter into the renal pelvis under fluoroscopic guidance.  A 4.5 Fr semirigid ureteroscope was then advanced into the ureter over a 0.25 guidewire due to her ureterocele.  No calculus was identified in the distal ureter.  The ureteroscope was advanced up to the kidney and no calculus was identified.  The guidewires were removed and a pullout retrograde pyelogram was performed through the ureteroscope with findings as described above.  As there was prompt emptying of contrast seen on fluoroscopy it was elected not to place a ureteral stent.  The bladder was then emptied and the procedure ended.  The patient appeared to tolerate the procedure well and without complications.  After anesthetic reversal the patient was transported to the PACU in stable condition.    Jasmine AltesScott C Lakishia Bourassa, MD

## 2017-09-24 NOTE — Transfer of Care (Cosign Needed)
Immediate Anesthesia Transfer of Care Note  Patient: Jasmine Henderson  Procedure(s) Performed: CYSTOSCOPY/URETEROSCOPY/left retrorade pyleogram (Left )  Patient Location: PACU  Anesthesia Type:General  Level of Consciousness: drowsy  Airway & Oxygen Therapy: Patient Spontanous Breathing and Patient connected to face mask oxygen  Post-op Assessment: Report given to RN and Post -op Vital signs reviewed and stable  Post vital signs: Reviewed and stable  Last Vitals:  Vitals Value Taken Time  BP 133/78 09/24/2017 10:57 AM  Temp 36 C 09/24/2017 10:56 AM  Pulse 92 09/24/2017 10:57 AM  Resp 16 09/24/2017 10:57 AM  SpO2 100 % 09/24/2017 10:57 AM  Vitals shown include unvalidated device data.  Last Pain:  Vitals:   09/24/17 1056  TempSrc: Tympanic  PainSc:          Complications: No apparent anesthesia complications

## 2017-09-24 NOTE — Interval H&P Note (Signed)
History and Physical Interval Note:  09/24/2017 10:06 AM  Jasmine Henderson  has presented today for surgery, with the diagnosis of left UVJ stone  The various methods of treatment have been discussed with the patient and family. After consideration of risks, benefits and other options for treatment, the patient has consented to  Procedure(s): CYSTOSCOPY/URETEROSCOPY/HOLMIUM LASER/STENT PLACEMENT (Left) as a surgical intervention .  The patient's history has been reviewed, patient examined, no change in status, stable for surgery.  I have reviewed the patient's chart and labs.  Questions were answered to the patient's satisfaction.     Scott C Stoioff

## 2017-09-24 NOTE — Anesthesia Procedure Notes (Signed)
Procedure Name: LMA Insertion Date/Time: 09/24/2017 10:14 AM Performed by: Almeta Monas, CRNA Pre-anesthesia Checklist: Patient identified, Emergency Drugs available, Suction available, Patient being monitored and Timeout performed Patient Re-evaluated:Patient Re-evaluated prior to induction Oxygen Delivery Method: Circle system utilized Preoxygenation: Pre-oxygenation with 100% oxygen Induction Type: IV induction Ventilation: Mask ventilation without difficulty LMA: LMA inserted LMA Size: 3.5 Number of attempts: 1 Tube secured with: Tape Dental Injury: Teeth and Oropharynx as per pre-operative assessment

## 2017-09-24 NOTE — Anesthesia Preprocedure Evaluation (Signed)
Anesthesia Evaluation  Patient identified by MRN, date of birth, ID band Patient awake  General Assessment Comment:No prior GA, neg fam hx  Reviewed: Allergy & Precautions, H&P , NPO status , reviewed documented beta blocker date and time   Airway Mallampati: II  TM Distance: >3 FB Neck ROM: full    Dental  (+) Chipped Gapped teeth:   Pulmonary neg pulmonary ROS,    Pulmonary exam normal        Cardiovascular negative cardio ROS Normal cardiovascular exam     Neuro/Psych negative neurological ROS  negative psych ROS   GI/Hepatic Neg liver ROS, neg GERD  ,  Endo/Other  Hypothyroidism   Renal/GU      Musculoskeletal   Abdominal   Peds  Hematology negative hematology ROS (+)   Anesthesia Other Findings   Reproductive/Obstetrics                             Anesthesia Physical Anesthesia Plan  ASA: II  Anesthesia Plan: General LMA   Post-op Pain Management:    Induction:   PONV Risk Score and Plan: 4 or greater and Ondansetron, Midazolam, Dexamethasone and Promethazine  Airway Management Planned:   Additional Equipment:   Intra-op Plan:   Post-operative Plan:   Informed Consent: I have reviewed the patients History and Physical, chart, labs and discussed the procedure including the risks, benefits and alternatives for the proposed anesthesia with the patient or authorized representative who has indicated his/her understanding and acceptance.   Dental Advisory Given  Plan Discussed with: CRNA  Anesthesia Plan Comments:         Anesthesia Quick Evaluation

## 2017-09-24 NOTE — Discharge Instructions (Signed)

## 2017-09-24 NOTE — Anesthesia Postprocedure Evaluation (Signed)
Anesthesia Post Note  Patient: Jasmine Henderson  Procedure(s) Performed: CYSTOSCOPY/URETEROSCOPY/left retrorade pyleogram (Left )  Patient location during evaluation: PACU Anesthesia Type: General Level of consciousness: awake and alert Pain management: pain level controlled Vital Signs Assessment: post-procedure vital signs reviewed and stable Respiratory status: spontaneous breathing, nonlabored ventilation and respiratory function stable Cardiovascular status: blood pressure returned to baseline and stable Postop Assessment: no apparent nausea or vomiting Anesthetic complications: no     Last Vitals:  Vitals:   09/24/17 1146 09/24/17 1221  BP: (!) 151/76 129/73  Pulse: 82 83  Resp: 16 16  Temp: 36.7 C   SpO2: 100% 100%    Last Pain:  Vitals:   09/24/17 1221  TempSrc:   PainSc: 0-No pain                 Christia ReadingScott T Lyndall Bellot

## 2017-09-30 ENCOUNTER — Telehealth: Payer: Self-pay | Admitting: Radiology

## 2017-09-30 NOTE — Telephone Encounter (Signed)
Pt's daughter, Kandis CockingMaritza, called to set up a second surgery that I was not aware of. Please advise.

## 2017-10-02 ENCOUNTER — Other Ambulatory Visit: Payer: Self-pay | Admitting: Radiology

## 2017-10-02 DIAGNOSIS — N201 Calculus of ureter: Secondary | ICD-10-CM

## 2017-10-02 DIAGNOSIS — N200 Calculus of kidney: Secondary | ICD-10-CM

## 2017-10-02 NOTE — Telephone Encounter (Signed)
She has a large staghorn calculus in her other kidney.  Jasmine Henderson had talked to Dr. Apolinar JunesBrandon about PCNL.

## 2017-10-24 ENCOUNTER — Ambulatory Visit
Admission: RE | Admit: 2017-10-24 | Discharge: 2017-10-24 | Disposition: A | Discharge status: Home / Self Care | Payer: Self-pay | Source: Ambulatory Visit | Attending: Urology | Admitting: Urology

## 2017-10-24 DIAGNOSIS — N200 Calculus of kidney: Secondary | ICD-10-CM | POA: Insufficient documentation

## 2017-10-24 DIAGNOSIS — N2889 Other specified disorders of kidney and ureter: Secondary | ICD-10-CM | POA: Insufficient documentation

## 2017-10-24 DIAGNOSIS — N201 Calculus of ureter: Secondary | ICD-10-CM | POA: Insufficient documentation

## 2017-10-29 ENCOUNTER — Other Ambulatory Visit: Payer: Self-pay | Admitting: Radiology

## 2017-10-29 ENCOUNTER — Encounter: Payer: Self-pay | Admitting: Urology

## 2017-10-29 ENCOUNTER — Ambulatory Visit (INDEPENDENT_AMBULATORY_CARE_PROVIDER_SITE_OTHER): Payer: Self-pay | Admitting: Urology

## 2017-10-29 VITALS — BP 125/83 | HR 73 | Ht 62.0 in | Wt 156.0 lb

## 2017-10-29 DIAGNOSIS — N201 Calculus of ureter: Secondary | ICD-10-CM

## 2017-10-29 DIAGNOSIS — N200 Calculus of kidney: Secondary | ICD-10-CM

## 2017-10-29 LAB — MICROSCOPIC EXAMINATION

## 2017-10-29 LAB — URINALYSIS, COMPLETE
Bilirubin, UA: NEGATIVE
GLUCOSE, UA: NEGATIVE
Ketones, UA: NEGATIVE
NITRITE UA: NEGATIVE
Protein, UA: NEGATIVE
Specific Gravity, UA: 1.01 (ref 1.005–1.030)
Urobilinogen, Ur: 0.2 mg/dL (ref 0.2–1.0)
pH, UA: 7 (ref 5.0–7.5)

## 2017-10-29 NOTE — H&P (View-Only) (Signed)
 10/29/2017 8:45 AM   Jasmine Henderson 09/26/1975 [id number]  Referring provider: Center, [facility] [address] [place], [place] [address]  Chief Complaint  Patient presents with  . Routine Post Op    HPI: 41-year-old female who returns the office today to discuss management of her right staghorn calculus.  She initially presented with bilateral stones including a left 6 mm UVJ stone.  She underwent left ureteroscopy, laser lithotripsy with Dr. Stoioff on 09/24/2017 which was uncomplicated and no stone was identified.  Her stent was subsequently removed and follow-up renal ultrasound shows resolution of her hydronephrosis on the side.  She continues to have intermittent lower abdominal pain and occasional left flank pain.  This has improved since surgery.  She denies any significant right flank pain.  She does report a history of recurrent UTIs.  She does have baseline urinary frequency and nocturia but no dysuria or gross hematuria.  No fevers or chills.  No previous stone history prior to the above.   PMH: Past Medical History:  Diagnosis Date  . History of kidney stones   . Hypothyroidism   . Pre-diabetes   . Thyroid disease     Surgical History: Past Surgical History:  Procedure Laterality Date  . CYSTOSCOPY/URETEROSCOPY/HOLMIUM LASER/STENT PLACEMENT Left 09/24/2017   Procedure: CYSTOSCOPY/URETEROSCOPY/left retrorade pyleogram;  Surgeon: Stoioff, Scott C, MD;  Location: [facility] ORS;  Service: Urology;  Laterality: Left;    Home Medications:  Allergies as of 10/29/2017   No Known Allergies     Medication List        Accurate as of 10/29/17 11:59 PM. Always use your most recent med list.          acetaminophen 500 MG tablet Commonly known as:  TYLENOL Take 1,000 mg by mouth every 6 (six) hours as needed (for pain.).   levothyroxine 100 MCG tablet Commonly known as:  SYNTHROID, LEVOTHROID Take 100 mcg by mouth daily before  breakfast.       Allergies: No Known Allergies  Family History: Family History  Problem Relation Age of Onset  . Bladder Cancer Neg Hx   . Kidney cancer Neg Hx     Social History:  reports that she has never smoked. She has never used smokeless tobacco. She reports that she does not drink alcohol or use drugs.  ROS: UROLOGY Frequent Urination?: Yes Hard to postpone urination?: No Burning/pain with urination?: No Get up at night to urinate?: Yes Leakage of urine?: Yes Urine stream starts and stops?: No Trouble starting stream?: No Do you have to strain to urinate?: No Blood in urine?: No Urinary tract infection?: No Sexually transmitted disease?: No Injury to kidneys or bladder?: No Painful intercourse?: No Weak stream?: No Currently pregnant?: No Vaginal bleeding?: No Last menstrual period?: 10/08/2017  Gastrointestinal Nausea?: No Vomiting?: No Indigestion/heartburn?: No Diarrhea?: No Constipation?: No  Constitutional Fever: No Night sweats?: No Weight loss?: No Fatigue?: No  Skin Skin rash/lesions?: Yes Itching?: No  Eyes Blurred vision?: Yes Double vision?: No  Ears/Nose/Throat Sore throat?: No Sinus problems?: No  Hematologic/Lymphatic Swollen glands?: No Easy bruising?: No  Cardiovascular Leg swelling?: No Chest pain?: No  Respiratory Cough?: No Shortness of breath?: No  Endocrine Excessive thirst?: No  Musculoskeletal Back pain?: Yes Joint pain?: No  Neurological Headaches?: No Dizziness?: No  Psychologic Depression?: No Anxiety?: No  Physical Exam: BP 125/83 (BP Location: Left Arm, Patient Position: Sitting, Cuff Size: Large)   Pulse 73   Ht 5' 2" (  1.575 m)   Wt 156 lb (70.8 kg)   LMP 10/08/2017   SpO2 99%   BMI 28.53 kg/m   Constitutional:  Alert and oriented, No acute distress.  Accompanied by Spanish translator today. HEENT: [place] AT, moist mucus membranes.  Trachea midline, no masses. Cardiovascular: No  clubbing, cyanosis, or edema. Respiratory: Normal respiratory effort, no increased work of breathing. Skin: No rashes, bruises or suspicious lesions. Neurologic: Grossly intact, no focal deficits, moving all 4 extremities. Psychiatric: Normal mood and affect.  Laboratory Data: Lab Results  Component Value Date   WBC 5.9 09/17/2017   HGB 14.0 09/17/2017   HCT 42.0 09/17/2017   MCV 89.9 09/17/2017   PLT 314 09/17/2017    Lab Results  Component Value Date   CREATININE 0.55 09/17/2017    Urinalysis Results for orders placed or performed in visit on 10/29/17  Microscopic Examination  Result Value Ref Range   WBC, UA 0-5 0 - 5 /hpf   RBC, UA 3-10 (A) 0 - 2 /hpf   Epithelial Cells (non renal) 0-10 0 - 10 /hpf   Bacteria, UA Few (A) None seen/Few  Urinalysis, Complete  Result Value Ref Range   Specific Gravity, UA 1.010 1.005 - 1.030   pH, UA 7.0 5.0 - 7.5   Color, UA Yellow Yellow   Appearance Ur Clear Clear   Leukocytes, UA Trace (A) Negative   Protein, UA Negative Negative/Trace   Glucose, UA Negative Negative   Ketones, UA Negative Negative   RBC, UA 1+ (A) Negative   Bilirubin, UA Negative Negative   Urobilinogen, Ur 0.2 0.2 - 1.0 mg/dL   Nitrite, UA Negative Negative   Microscopic Examination See below:     Pertinent Imaging: Results for orders placed during the hospital encounter of 10/24/17  US RENAL   Narrative CLINICAL DATA:  41-year-old female with RIGHT staghorn calculus. Recent cystoscopy and LEFT retrograde study.  EXAM: RENAL / URINARY TRACT ULTRASOUND COMPLETE  COMPARISON:  None.  FINDINGS: Right Kidney:  Length: 11.9 cm. A large RIGHT staghorn calculus is identified as noted on recent CT. There is no evidence of hydronephrosis or solid mass. Renal echogenicity is normal.  Left Kidney:  Length: 11.5 cm. Echogenicity within normal limits. No mass or hydronephrosis visualized.  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 Normal bilateral  ureteral jets are identified. The bladder is otherwise unremarkable.  IMPRESSION: 1. No evidence of hydronephrosis. 2. Large RIGHT staghorn calculus and LEFT ureterocele again identified. 3. Unremarkable LEFT kidney.   Electronically Signed   By: Jeffrey  Hu M.D.   On: 10/25/2017 10:27    Renal ultrasound was personally reviewed today and with the patient.  In addition, her CT stone protocol from 08/30/2017 was also personally reviewed and with the patient.  Agree with assessment, right for staghorn occupying all calyces extending into UPJ without overt obstruction.  Renal parenchyma on the right appears normal.  Assessment & Plan:    1. Left ureteral stone Status post negative left ureteroscopy, stone presumably passed spontaneously prior to procedure Follow-up renal ultrasound without any evidence of ongoing left ureteral obstruction Etiology of intermittent left flank pain unclear as there is no obstructing stones No evidence of UTI today - Urinalysis, Complete  2. Staghorn calculus Large full right staghorn calculus with preserved renal parenchyma  Lengthy discussion today about definitive management of the stone.  Recommended proceeding to the operating room for right PCNL.  Given the size of the stone occupying all calyces and   renal pelvis extending into the UPJ, she understands that the procedure may be staged and require multiple procedures.  We discussed the preoperative, intraoperative, and postoperative details of the surgery.  We discussed the risk of surgery including injury to the kidney or surrounding structures, bleeding including risk of need for blood transfusion or potentially embolization in the emergency setting, urine leak, tube placement including nephrostomy and/or ureteral stent, pain, infection amongst others.  All questions were answered today.  We will arrange for right PCNL in the near future.  Preop labs/UA urine culture.  All questions were answered today  in conversation was facilitated by Spanish translator.  - CULTURE, URINE COMPREHENSIVE  Morna Flud, MD  [facility] [address], [address] [place], [place] [address] [phone]  I spent 25 min with this patient of which greater than 50% was spent in counseling and coordination of care with the patient.   

## 2017-10-29 NOTE — Progress Notes (Signed)
10/29/2017 8:45 AM   Jasmine Henderson [birth year] [id number]  Referring provider: Center, [facility] [address] [place], [place] [address]  Chief Complaint  Patient presents with  . Routine Post Op    HPI: 42 year old female who returns the office today to discuss management of her right staghorn calculus.  She initially presented with bilateral stones including a left 6 mm UVJ stone.  She underwent left ureteroscopy, laser lithotripsy with Dr. Lonna Cobb on 09/24/2017 which was uncomplicated and no stone was identified.  Her stent was subsequently removed and follow-up renal ultrasound shows resolution of her hydronephrosis on the side.  She continues to have intermittent lower abdominal pain and occasional left flank pain.  This has improved since surgery.  She denies any significant right flank pain.  She does report a history of recurrent UTIs.  She does have baseline urinary frequency and nocturia but no dysuria or gross hematuria.  No fevers or chills.  No previous stone history prior to the above.   PMH: Past Medical History:  Diagnosis Date  . History of kidney stones   . Hypothyroidism   . Pre-diabetes   . Thyroid disease     Surgical History: Past Surgical History:  Procedure Laterality Date  . CYSTOSCOPY/URETEROSCOPY/HOLMIUM LASER/STENT PLACEMENT Left 09/24/2017   Procedure: CYSTOSCOPY/URETEROSCOPY/left retrorade pyleogram;  Surgeon: Riki Altes, MD;  Location: [facility] ORS;  Service: Urology;  Laterality: Left;    Home Medications:  Allergies as of 10/29/2017   No Known Allergies     Medication List        Accurate as of 10/29/17 11:59 PM. Always use your most recent med list.          acetaminophen 500 MG tablet Commonly known as:  TYLENOL Take 1,000 mg by mouth every 6 (six) hours as needed (for pain.).   levothyroxine 100 MCG tablet Commonly known as:  SYNTHROID, LEVOTHROID Take 100 mcg by mouth daily before  breakfast.       Allergies: No Known Allergies  Family History: Family History  Problem Relation Age of Onset  . Bladder Cancer Neg Hx   . Kidney cancer Neg Hx     Social History:  reports that she has never smoked. She has never used smokeless tobacco. She reports that she does not drink alcohol or use drugs.  ROS: UROLOGY Frequent Urination?: Yes Hard to postpone urination?: No Burning/pain with urination?: No Get up at night to urinate?: Yes Leakage of urine?: Yes Urine stream starts and stops?: No Trouble starting stream?: No Do you have to strain to urinate?: No Blood in urine?: No Urinary tract infection?: No Sexually transmitted disease?: No Injury to kidneys or bladder?: No Painful intercourse?: No Weak stream?: No Currently pregnant?: No Vaginal bleeding?: No Last menstrual period?: 10/08/2017  Gastrointestinal Nausea?: No Vomiting?: No Indigestion/heartburn?: No Diarrhea?: No Constipation?: No  Constitutional Fever: No Night sweats?: No Weight loss?: No Fatigue?: No  Skin Skin rash/lesions?: Yes Itching?: No  Eyes Blurred vision?: Yes Double vision?: No  Ears/Nose/Throat Sore throat?: No Sinus problems?: No  Hematologic/Lymphatic Swollen glands?: No Easy bruising?: No  Cardiovascular Leg swelling?: No Chest pain?: No  Respiratory Cough?: No Shortness of breath?: No  Endocrine Excessive thirst?: No  Musculoskeletal Back pain?: Yes Joint pain?: No  Neurological Headaches?: No Dizziness?: No  Psychologic Depression?: No Anxiety?: No  Physical Exam: BP 125/83 (BP Location: Left Arm, Patient Position: Sitting, Cuff Size: Large)   Pulse 73   Ht  (  1.575 m)   Wt 156 lb (70.8 kg)   LMP 10/08/2017   SpO2 99%   BMI 28.53 kg/m   Constitutional:  Alert and oriented, No acute distress.  Accompanied by [place] translator today. HEENT: [place] AT, moist mucus membranes.  Trachea midline, no masses. Cardiovascular: No  clubbing, cyanosis, or edema. Respiratory: Normal respiratory effort, no increased work of breathing. Skin: No rashes, bruises or suspicious lesions. Neurologic: Grossly intact, no focal deficits, moving all 4 extremities. Psychiatric: Normal mood and affect.  Laboratory Data: Lab Results  Component Value Date   WBC 5.9 09/17/2017   HGB 14.0 09/17/2017   HCT 42.0 09/17/2017   MCV 89.9 09/17/2017   PLT 314 09/17/2017    Lab Results  Component Value Date   CREATININE 0.55 09/17/2017    Urinalysis Results for orders placed or performed in visit on 10/29/17  Microscopic Examination  Result Value Ref Range   WBC, UA 0-5 0 - 5 /hpf   RBC, UA 3-10 (A) 0 - 2 /hpf   Epithelial Cells (non renal) 0-10 0 - 10 /hpf   Bacteria, UA Few (A) None seen/Few  Urinalysis, Complete  Result Value Ref Range   Specific Gravity, UA 1.010 1.005 - 1.030   pH, UA 7.0 5.0 - 7.5   Color, UA Yellow Yellow   Appearance Ur Clear Clear   Leukocytes, UA Trace (A) Negative   Protein, UA Negative Negative/Trace   Glucose, UA Negative Negative   Ketones, UA Negative Negative   RBC, UA 1+ (A) Negative   Bilirubin, UA Negative Negative   Urobilinogen, Ur 0.2 0.2 - 1.0 mg/dL   Nitrite, UA Negative Negative   Microscopic Examination See below:     Pertinent Imaging: Results for orders placed during the hospital encounter of 10/24/17  US RENAL   Narrative CLINICAL DATA:  42 year old female with RIGHT staghorn calculus. Recent cystoscopy and LEFT retrograde study.  EXAM: RENAL / URINARY TRACT ULTRASOUND COMPLETE  COMPARISON:  None.  FINDINGS: Right Kidney:  Length: 11.9 cm. A large RIGHT staghorn calculus is identified as noted on recent CT. There is no evidence of hydronephrosis or solid mass. Renal echogenicity is normal.  Left Kidney:  Length: 11.5 cm. Echogenicity within normal limits. No mass or hydronephrosis visualized.  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 Normal bilateral  ureteral jets are identified. The bladder is otherwise unremarkable.  IMPRESSION: 1. No evidence of hydronephrosis. 2. Large RIGHT staghorn calculus and LEFT ureterocele again identified. 3. Unremarkable LEFT kidney.   Electronically Signed   By: Harmon Pier M.D.   On: 10/25/2017 10:27    Renal ultrasound was personally reviewed today and with the patient.  In addition, her CT stone protocol from 08/30/2017 was also personally reviewed and with the patient.  Agree with assessment, right for staghorn occupying all calyces extending into UPJ without overt obstruction.  Renal parenchyma on the right appears normal.  Assessment & Plan:    1. Left ureteral stone Status post negative left ureteroscopy, stone presumably passed spontaneously prior to procedure Follow-up renal ultrasound without any evidence of ongoing left ureteral obstruction Etiology of intermittent left flank pain unclear as there is no obstructing stones No evidence of UTI today - Urinalysis, Complete  2. Staghorn calculus Large full right staghorn calculus with preserved renal parenchyma  Lengthy discussion today about definitive management of the stone.  Recommended proceeding to the operating room for right PCNL.  Given the size of the stone occupying all calyces and  renal pelvis extending into the UPJ, she understands that the procedure may be staged and require multiple procedures.  We discussed the preoperative, intraoperative, and postoperative details of the surgery.  We discussed the risk of surgery including injury to the kidney or surrounding structures, bleeding including risk of need for blood transfusion or potentially embolization in the emergency setting, urine leak, tube placement including nephrostomy and/or ureteral stent, pain, infection amongst others.  All questions were answered today.  We will arrange for right PCNL in the near future.  Preop labs/UA urine culture.  All questions were answered today  in conversation was facilitated by Spanish translator.  - CULTURE, URINE COMPREHENSIVE  Vanna Scotland, MD  [facility] [address] [place], [place] [address] [phone]  I spent 25 min with this patient of which greater than 50% was spent in counseling and coordination of care with the patient.

## 2017-10-30 ENCOUNTER — Other Ambulatory Visit: Payer: Self-pay | Admitting: Radiology

## 2017-10-30 DIAGNOSIS — N200 Calculus of kidney: Secondary | ICD-10-CM

## 2017-10-31 LAB — CULTURE, URINE COMPREHENSIVE

## 2017-11-01 ENCOUNTER — Other Ambulatory Visit: Payer: Self-pay | Admitting: Radiology

## 2017-11-18 ENCOUNTER — Other Ambulatory Visit: Payer: Self-pay

## 2017-11-18 ENCOUNTER — Encounter
Admission: RE | Admit: 2017-11-18 | Discharge: 2017-11-18 | Disposition: A | Discharge status: Home / Self Care | Payer: Self-pay | Source: Ambulatory Visit | Attending: Urology | Admitting: Urology

## 2017-11-18 DIAGNOSIS — Z01818 Encounter for other preprocedural examination: Secondary | ICD-10-CM | POA: Insufficient documentation

## 2017-11-18 HISTORY — DX: Unspecified convulsions: R56.9

## 2017-11-18 LAB — URINALYSIS, ROUTINE W REFLEX MICROSCOPIC
Bacteria, UA: NONE SEEN
Bilirubin Urine: NEGATIVE
GLUCOSE, UA: NEGATIVE mg/dL
Ketones, ur: NEGATIVE mg/dL
Leukocytes, UA: NEGATIVE
NITRITE: NEGATIVE
PROTEIN: NEGATIVE mg/dL
Specific Gravity, Urine: 1.004 — ABNORMAL LOW (ref 1.005–1.030)
pH: 6 (ref 5.0–8.0)

## 2017-11-18 LAB — CBC
HCT: 39.6 % (ref 35.0–47.0)
Hemoglobin: 13.6 g/dL (ref 12.0–16.0)
MCH: 30.7 pg (ref 26.0–34.0)
MCHC: 34.3 g/dL (ref 32.0–36.0)
MCV: 89.5 fL (ref 80.0–100.0)
PLATELETS: 297 10*3/uL (ref 150–440)
RBC: 4.42 MIL/uL (ref 3.80–5.20)
RDW: 13.3 % (ref 11.5–14.5)
WBC: 5.6 10*3/uL (ref 3.6–11.0)

## 2017-11-18 LAB — BASIC METABOLIC PANEL
Anion gap: 8 (ref 5–15)
BUN: 12 mg/dL (ref 6–20)
CHLORIDE: 105 mmol/L (ref 101–111)
CO2: 25 mmol/L (ref 22–32)
CREATININE: 0.44 mg/dL (ref 0.44–1.00)
Calcium: 9.1 mg/dL (ref 8.9–10.3)
GFR calc Af Amer: >60 mL/min (ref >60)
GFR calc non Af Amer: >60 mL/min (ref >60)
Glucose, Bld: 97 mg/dL (ref 65–99)
Potassium: 3.5 mmol/L (ref 3.5–5.1)
SODIUM: 138 mmol/L (ref 135–145)

## 2017-11-18 LAB — TYPE AND SCREEN
ABO/RH(D): O POS
Antibody Screen: NEGATIVE

## 2017-11-18 LAB — PROTIME-INR
INR: 0.96
Prothrombin Time: 12.7 seconds (ref 11.4–15.2)

## 2017-11-18 NOTE — Patient Instructions (Signed)
Your procedure is scheduled on:November 25, 2017 Su procedimiento est programado para: Report to THE SECOND FLOOR OF THE MEDICAL MALL. DO NOT STOP ON THE FIRST FLOOR  Presntese a:  To find out your arrival time please call [phone] between 1PM - 3PM on Friday, June 7TH, 2019 Para saber su hora de llegada por favor llame al [phone] entre la 1PM - 3PM el da:  Remember: Instructions that are not followed completely may result in serious medical risk,  up to and including death, or upon the discretion of your surgeon and anesthesiologist your  surgery may need to be rescheduled.  Recuerde: Las instrucciones que no se siguen completamente Armed forces logistics/support/administrative officer en un riesgo de salud grave,  incluyendo hasta la [place] o a discrecin de su cirujano y Scientific laboratory technician, su ciruga se puede posponer.   __X_ 1.Do not eat food after midnight the night before your procedure. No gum chewing or hard candies.                             You may drink clear liquids up to 2 hours before you are scheduled to arrive for your surgery-                              DO not drink clear liquids within 2 hours of the start of your surgery.     Clear Liquids include: water, apple juice without pulp, clear carbohydrate drink such as     Clearfast of [place], Black Coffee or Tea (Do not add anything to coffee or tea).      No coma nada despus de la medianoche de la noche anterior a su procedimiento.                              No coma chicles ni caramelos duros. Puede tomar lquidos claros hasta 2 horas antes                              de su hora programada de llegada al hospital para su procedimiento. No tome lquidos claros                                  durante el transcurso de las 2 horas de su llegada programada al hospital para su                       procedimiento, ya que esto puede llevar a que su procedimiento se retrase o tenga                                        que volver a Magazine features editor.  Los  lquidos claros incluyen:         - Agua o jugo de [place] sin pulpa         - Bebidas claras con carbohidratos como ClearFast o Gatorade         - Caf negro o t claro (sin leche, sin cremas, no agregue nada al caf ni al  t)  No tome nada que no est en esta lista.  Los pacientes con diabetes  tipo 1 y tipo 2 solo deben Printmakertomar agua.  Llame a la clnica de PreCare o a la unidad de Same Day Surgery sI ene alguna pregunta sobre estas instrucciones.              _X__ 2.Do Not Smoke or use e-cigarettes For 24 Hours Prior to Your  Surgery.                             Do not use any chewable tobacco products for at least 6 hours prior to surgery.    No fume ni use cigarrillos electrnicos durante las 24 horas previas a su [place].                            No use ningn producto de tabaco masticable durante al menos 6 horas antes de la [place].     __X_ 3. No alcohol for 24 hours before or after surgery.    No tome alcohol durante las 24 horas antes ni despus de la [place].   ____4. Bring all medications with you on the day of surgery if instructed.    Lleve todos los medicamentos con usted el da de su ciruga si se le ha indicado as.   _X___ 5. Notify your doctor if there is any change in your medical condition (cold,fever, infections).    Informe a su mdico si hay algn cambio en su condicin mdica  (resfriado, fiebre, infecciones).   Do not wear jewelry, make-up, hairpins, clips or nail polish.  No use joyas, maquillajes, pinzas/ganchos para el cabello ni esmalte de uas.  Do not wear lotions, powders, or perfumes. You may wear deodorant.  No use lociones, polvos o perfumes.  Puede usar desodorante.    Do not shave 48 hours prior to surgery. Men may shave face and neck.  No se afeite 48 horas antes de la [place].  Los hombres pueden Commercial Metals Companyafeitarse la cara  y el cuello.   Do not bring valuables to the hospital.   No lleve objetos de valor al hospital.  [facility]  any belongings or valuables.  [place] no se hace responsable de ningn tipo de pertenencias u objetos de Licensed conveyancervalor.               Contacts, dentures or bridgework may not be worn into surgery.  Los lentes de [place], las dentaduras postizas o puentes no se pueden usar en la [place].  Leave your suitcase in the car. After surgery it may be brought to your room.  Deje su maleta en el auto.  Despus de la ciruga podr traerla a su habitacin.  For patients admitted to the hospital, discharge time is determined by your treatment team.  Para los pacientes que sean ingresados al hospital, el tiempo en el cual se le dar de alta es determinado por su equipo de [place]tratamiento.   Patients discharged the day of surgery will not be allowed to drive home. A los pacientes que se les da de alta el mismo da de la ciruga no se les permitir conducir a Higher education careers advisercasa.   Please read over the following fact sheets that you were given: Por favor lea las siguientes hojas de informacin que le dieron:   PREPARING FOR SURGERY \ ____ Take these medicines the morning of surgery with A SIP OF WATER:  Tome estas medicinas la maana de la ciruga con UN SORBO DE AGUA:  1. LEVOTHYROXINE/SYNTHROID  2.   3.   4.       5.  6.  ____ Fleet Enema (as directed)          Enema de Fleet (segn lo indicado)    _X___ Use CHG Soap as directed          Utilice el jabn de CHG segn lo indicado  ____ Use inhalers on the day of surgery          Use los inhaladores el da de la ciruga  __X__ Stop ASPIRIN PRODUCTS TODAY          Deje de tomar el ASPIRIN  el da:  ___X_ Stop Anti-inflammatories NOW            Deje de tomar antiinflamatorios el da:   ____ Stop supplements until after surgery            Deje de tomar suplementos hasta despus de la ciruga  ____ Bring C-Pap to the hospital          Lleve el C-Pap al hospital   YOU MAY TAKE TYLENOL AT ANY TIME  WEAR COMFORTABLE CLOTHES TO THE HOSPITAL

## 2017-11-18 NOTE — Pre-Procedure Instructions (Signed)
Interpreter, Jasmine Henderson was here for entire interview in P.A.T. Questions regarding procedure and postoperative care were reviewed and answered. Patient felt comfortable with upcoming procedure.

## 2017-11-19 LAB — URINE CULTURE: Culture: NO GROWTH

## 2017-11-19 NOTE — Pre-Procedure Instructions (Signed)
UA results sent to Dr. Brandon for review.  

## 2017-11-22 ENCOUNTER — Other Ambulatory Visit: Payer: Self-pay | Admitting: Radiology

## 2017-11-24 MED ORDER — CEFAZOLIN SODIUM-DEXTROSE 2-4 GM/100ML-% IV SOLN
2.0000 g | INTRAVENOUS | Status: AC
  Administered 2017-11-25: 2 g via INTRAVENOUS
  Filled 2017-11-24: qty 100

## 2017-11-25 ENCOUNTER — Ambulatory Visit
Admission: RE | Admit: 2017-11-25 | Discharge: 2017-11-25 | Disposition: A | Discharge status: Home / Self Care | Payer: Self-pay | Source: Ambulatory Visit | Attending: Urology | Admitting: Urology

## 2017-11-25 ENCOUNTER — Other Ambulatory Visit: Payer: Self-pay

## 2017-11-25 ENCOUNTER — Observation Stay
Admission: RE | Admit: 2017-11-25 | Discharge: 2017-11-26 | Disposition: A | Discharge status: Home / Self Care | Payer: Self-pay | Source: Ambulatory Visit | Attending: Urology | Admitting: Urology

## 2017-11-25 ENCOUNTER — Encounter
Admission: RE | Disposition: A | Discharge status: Home / Self Care | Payer: Self-pay | Source: Ambulatory Visit | Attending: Urology

## 2017-11-25 ENCOUNTER — Ambulatory Visit: Payer: Self-pay

## 2017-11-25 ENCOUNTER — Ambulatory Visit: Payer: Self-pay | Admitting: Certified Registered"

## 2017-11-25 ENCOUNTER — Encounter: Payer: Self-pay | Admitting: *Deleted

## 2017-11-25 DIAGNOSIS — Z419 Encounter for procedure for purposes other than remedying health state, unspecified: Secondary | ICD-10-CM

## 2017-11-25 DIAGNOSIS — N200 Calculus of kidney: Secondary | ICD-10-CM

## 2017-11-25 DIAGNOSIS — Z87442 Personal history of urinary calculi: Secondary | ICD-10-CM | POA: Insufficient documentation

## 2017-11-25 DIAGNOSIS — Z79899 Other long term (current) drug therapy: Secondary | ICD-10-CM | POA: Insufficient documentation

## 2017-11-25 DIAGNOSIS — E039 Hypothyroidism, unspecified: Secondary | ICD-10-CM | POA: Insufficient documentation

## 2017-11-25 HISTORY — PX: NEPHROLITHOTOMY: SHX5134

## 2017-11-25 HISTORY — PX: IR NEPHROSTOMY PLACEMENT RIGHT: IMG6064

## 2017-11-25 HISTORY — DX: Calculus of kidney: N20.0

## 2017-11-25 LAB — GLUCOSE, CAPILLARY
GLUCOSE-CAPILLARY: 138 mg/dL — AB (ref 65–99)
Glucose-Capillary: 118 mg/dL — ABNORMAL HIGH (ref 65–99)

## 2017-11-25 LAB — ABO/RH: ABO/RH(D): O POS

## 2017-11-25 LAB — POCT PREGNANCY, URINE: PREG TEST UR: NEGATIVE

## 2017-11-25 SURGERY — NEPHROLITHOTOMY PERCUTANEOUS
Anesthesia: General | Laterality: Right | Wound class: Clean

## 2017-11-25 MED ORDER — LACTATED RINGERS IV SOLN
INTRAVENOUS | Status: DC
  Administered 2017-11-25 (×2): via INTRAVENOUS

## 2017-11-25 MED ORDER — HYDROMORPHONE HCL 1 MG/ML IJ SOLN
INTRAMUSCULAR | Status: AC
  Filled 2017-11-25: qty 1

## 2017-11-25 MED ORDER — CEFAZOLIN SODIUM-DEXTROSE 1-4 GM/50ML-% IV SOLN
1.0000 g | Freq: Three times a day (TID) | INTRAVENOUS | Status: AC
  Administered 2017-11-25 – 2017-11-26 (×2): 1 g via INTRAVENOUS
  Filled 2017-11-25 (×2): qty 50

## 2017-11-25 MED ORDER — MIDAZOLAM HCL 2 MG/2ML IJ SOLN
INTRAMUSCULAR | Status: AC | PRN
  Administered 2017-11-25 (×2): 1 mg via INTRAVENOUS

## 2017-11-25 MED ORDER — ONDANSETRON HCL 4 MG/2ML IJ SOLN
4.0000 mg | INTRAMUSCULAR | Status: DC | PRN
  Administered 2017-11-25 – 2017-11-26 (×2): 4 mg via INTRAVENOUS
  Filled 2017-11-25 (×3): qty 2

## 2017-11-25 MED ORDER — DIPHENHYDRAMINE HCL 12.5 MG/5ML PO ELIX
12.5000 mg | ORAL_SOLUTION | Freq: Four times a day (QID) | ORAL | Status: DC | PRN
  Filled 2017-11-25: qty 5

## 2017-11-25 MED ORDER — SODIUM CHLORIDE 0.9 % IV SOLN: INTRAVENOUS | Status: DC

## 2017-11-25 MED ORDER — FENTANYL CITRATE (PF) 100 MCG/2ML IJ SOLN
INTRAMUSCULAR | Status: AC
  Filled 2017-11-25: qty 2

## 2017-11-25 MED ORDER — FENTANYL CITRATE (PF) 100 MCG/2ML IJ SOLN
INTRAMUSCULAR | Status: DC | PRN
  Administered 2017-11-25 (×2): 50 ug via INTRAVENOUS
  Administered 2017-11-25: 100 ug via INTRAVENOUS

## 2017-11-25 MED ORDER — ONDANSETRON HCL 4 MG/2ML IJ SOLN
INTRAMUSCULAR | Status: AC
  Filled 2017-11-25: qty 2

## 2017-11-25 MED ORDER — FAMOTIDINE 20 MG PO TABS
20.0000 mg | ORAL_TABLET | Freq: Once | ORAL | Status: AC
  Administered 2017-11-25: 20 mg via ORAL

## 2017-11-25 MED ORDER — MIDAZOLAM HCL 2 MG/2ML IJ SOLN
INTRAMUSCULAR | Status: DC | PRN
  Administered 2017-11-25: 2 mg via INTRAVENOUS

## 2017-11-25 MED ORDER — PROPOFOL 10 MG/ML IV BOLUS
INTRAVENOUS | Status: DC | PRN
  Administered 2017-11-25: 130 mg via INTRAVENOUS

## 2017-11-25 MED ORDER — ACETAMINOPHEN NICU IV SYRINGE 10 MG/ML
INTRAVENOUS | Status: AC
  Filled 2017-11-25: qty 1

## 2017-11-25 MED ORDER — ROCURONIUM BROMIDE 100 MG/10ML IV SOLN
INTRAVENOUS | Status: DC | PRN
  Administered 2017-11-25 (×5): 10 mg via INTRAVENOUS
  Administered 2017-11-25: 40 mg via INTRAVENOUS

## 2017-11-25 MED ORDER — MORPHINE SULFATE (PF) 2 MG/ML IV SOLN
2.0000 mg | INTRAVENOUS | Status: DC | PRN
  Administered 2017-11-26 (×4): 2 mg via INTRAVENOUS
  Filled 2017-11-25 (×4): qty 1

## 2017-11-25 MED ORDER — FENTANYL CITRATE (PF) 100 MCG/2ML IJ SOLN
INTRAMUSCULAR | Status: AC
  Administered 2017-11-25: 25 ug via INTRAVENOUS
  Filled 2017-11-25: qty 2

## 2017-11-25 MED ORDER — SEVOFLURANE IN SOLN
RESPIRATORY_TRACT | Status: AC
  Filled 2017-11-25: qty 250

## 2017-11-25 MED ORDER — IOPAMIDOL (ISOVUE-300) INJECTION 61%
30.0000 mL | Freq: Once | INTRAVENOUS | Status: AC | PRN
  Administered 2017-11-25: 10 mL via INTRAVENOUS

## 2017-11-25 MED ORDER — FENTANYL CITRATE (PF) 100 MCG/2ML IJ SOLN
INTRAMUSCULAR | Status: AC | PRN
  Administered 2017-11-25 (×2): 50 ug via INTRAVENOUS

## 2017-11-25 MED ORDER — OXYBUTYNIN CHLORIDE 5 MG PO TABS
5.0000 mg | ORAL_TABLET | Freq: Three times a day (TID) | ORAL | Status: DC | PRN
  Administered 2017-11-26: 5 mg via ORAL
  Filled 2017-11-25: qty 1

## 2017-11-25 MED ORDER — MIDAZOLAM HCL 2 MG/2ML IJ SOLN
INTRAMUSCULAR | Status: AC
  Filled 2017-11-25: qty 2

## 2017-11-25 MED ORDER — ROCURONIUM BROMIDE 50 MG/5ML IV SOLN
INTRAVENOUS | Status: AC
  Filled 2017-11-25: qty 1

## 2017-11-25 MED ORDER — SUGAMMADEX SODIUM 200 MG/2ML IV SOLN
INTRAVENOUS | Status: DC | PRN
  Administered 2017-11-25: 150 mg via INTRAVENOUS

## 2017-11-25 MED ORDER — PHENYLEPHRINE HCL 10 MG/ML IJ SOLN
INTRAMUSCULAR | Status: DC | PRN
  Administered 2017-11-25: 50 ug via INTRAVENOUS

## 2017-11-25 MED ORDER — DEXAMETHASONE SODIUM PHOSPHATE 10 MG/ML IJ SOLN
INTRAMUSCULAR | Status: DC | PRN
  Administered 2017-11-25: 8 mg via INTRAVENOUS

## 2017-11-25 MED ORDER — ONDANSETRON HCL 4 MG/2ML IJ SOLN
4.0000 mg | Freq: Once | INTRAMUSCULAR | Status: AC
  Administered 2017-11-25: 4 mg via INTRAVENOUS
  Filled 2017-11-25: qty 2

## 2017-11-25 MED ORDER — ONDANSETRON HCL 4 MG/2ML IJ SOLN
INTRAMUSCULAR | Status: DC | PRN
  Administered 2017-11-25: 4 mg via INTRAVENOUS

## 2017-11-25 MED ORDER — IOTHALAMATE MEGLUMINE 43 % IV SOLN
INTRAVENOUS | Status: DC | PRN
  Administered 2017-11-25: 30 mL via SURGICAL_CAVITY

## 2017-11-25 MED ORDER — FAMOTIDINE 20 MG PO TABS
ORAL_TABLET | ORAL | Status: AC
  Administered 2017-11-25: 20 mg via ORAL
  Filled 2017-11-25: qty 1

## 2017-11-25 MED ORDER — OXYCODONE-ACETAMINOPHEN 5-325 MG PO TABS
1.0000 | ORAL_TABLET | ORAL | Status: DC | PRN
  Administered 2017-11-25 – 2017-11-26 (×3): 1 via ORAL
  Administered 2017-11-26: 2 via ORAL
  Filled 2017-11-25: qty 1
  Filled 2017-11-25: qty 2
  Filled 2017-11-25 (×2): qty 1

## 2017-11-25 MED ORDER — DEXAMETHASONE SODIUM PHOSPHATE 10 MG/ML IJ SOLN
INTRAMUSCULAR | Status: AC
  Filled 2017-11-25: qty 1

## 2017-11-25 MED ORDER — SODIUM CHLORIDE 0.9 % IV SOLN
INTRAVENOUS | Status: DC
  Administered 2017-11-25 – 2017-11-26 (×2): via INTRAVENOUS

## 2017-11-25 MED ORDER — FENTANYL CITRATE (PF) 100 MCG/2ML IJ SOLN
25.0000 ug | INTRAMUSCULAR | Status: DC | PRN
  Administered 2017-11-25 (×3): 25 ug via INTRAVENOUS

## 2017-11-25 MED ORDER — ACETAMINOPHEN 325 MG PO TABS: 650.0000 mg | ORAL_TABLET | ORAL | Status: DC | PRN

## 2017-11-25 MED ORDER — BELLADONNA ALKALOIDS-OPIUM 16.2-60 MG RE SUPP
1.0000 | Freq: Four times a day (QID) | RECTAL | Status: DC | PRN
Start: 2017-11-25 — End: 2017-11-26

## 2017-11-25 MED ORDER — CEFAZOLIN SODIUM-DEXTROSE 2-4 GM/100ML-% IV SOLN
INTRAVENOUS | Status: AC
  Filled 2017-11-25: qty 100

## 2017-11-25 MED ORDER — DOCUSATE SODIUM 100 MG PO CAPS
100.0000 mg | ORAL_CAPSULE | Freq: Two times a day (BID) | ORAL | Status: DC
  Administered 2017-11-25 – 2017-11-26 (×2): 100 mg via ORAL
  Filled 2017-11-25 (×2): qty 1

## 2017-11-25 MED ORDER — PROPOFOL 10 MG/ML IV BOLUS
INTRAVENOUS | Status: AC
  Filled 2017-11-25: qty 20

## 2017-11-25 MED ORDER — HYDROCODONE-ACETAMINOPHEN 5-325 MG PO TABS: 1.0000 | ORAL_TABLET | ORAL | Status: DC | PRN

## 2017-11-25 MED ORDER — LIDOCAINE HCL (CARDIAC) PF 100 MG/5ML IV SOSY
PREFILLED_SYRINGE | INTRAVENOUS | Status: DC | PRN
  Administered 2017-11-25: 50 mg via INTRAVENOUS

## 2017-11-25 MED ORDER — LEVOTHYROXINE SODIUM 100 MCG PO TABS
100.0000 ug | ORAL_TABLET | Freq: Every day | ORAL | Status: DC
  Administered 2017-11-26: 100 ug via ORAL
  Filled 2017-11-25: qty 1

## 2017-11-25 MED ORDER — LIDOCAINE HCL (PF) 1 % IJ SOLN
INTRAMUSCULAR | Status: AC | PRN
  Administered 2017-11-25: 5 mL

## 2017-11-25 MED ORDER — DIPHENHYDRAMINE HCL 50 MG/ML IJ SOLN: 12.5000 mg | Freq: Four times a day (QID) | INTRAMUSCULAR | Status: DC | PRN

## 2017-11-25 MED ORDER — FENTANYL CITRATE (PF) 100 MCG/2ML IJ SOLN
25.0000 ug | Freq: Once | INTRAMUSCULAR | Status: AC
  Administered 2017-11-25: 25 ug via INTRAVENOUS
  Filled 2017-11-25: qty 0.5

## 2017-11-25 MED ORDER — HYDROMORPHONE HCL 1 MG/ML IJ SOLN
INTRAMUSCULAR | Status: DC | PRN
  Administered 2017-11-25: 0.5 mg via INTRAVENOUS

## 2017-11-25 MED ORDER — HEPARIN SODIUM (PORCINE) 5000 UNIT/ML IJ SOLN
5000.0000 [IU] | Freq: Three times a day (TID) | INTRAMUSCULAR | Status: DC
  Administered 2017-11-25 – 2017-11-26 (×2): 5000 [IU] via SUBCUTANEOUS
  Filled 2017-11-25 (×2): qty 1

## 2017-11-25 MED ORDER — CIPROFLOXACIN IN D5W 400 MG/200ML IV SOLN
400.0000 mg | INTRAVENOUS | Status: AC
  Administered 2017-11-25: 400 mg via INTRAVENOUS
  Filled 2017-11-25: qty 200

## 2017-11-25 MED ORDER — ACETAMINOPHEN 10 MG/ML IV SOLN
INTRAVENOUS | Status: DC | PRN
  Administered 2017-11-25: 1000 mg via INTRAVENOUS

## 2017-11-25 MED ORDER — ONDANSETRON HCL 4 MG/2ML IJ SOLN: 4.0000 mg | Freq: Once | INTRAMUSCULAR | Status: DC | PRN

## 2017-11-25 MED ORDER — LIDOCAINE HCL (PF) 1 % IJ SOLN
INTRAMUSCULAR | Status: AC
  Filled 2017-11-25: qty 30

## 2017-11-25 MED ORDER — LIDOCAINE HCL (PF) 2 % IJ SOLN
INTRAMUSCULAR | Status: AC
  Filled 2017-11-25: qty 10

## 2017-11-25 SURGICAL SUPPLY — 65 items
ADAPTER IRRIG TUBE 2 SPIKE SOL (ADAPTER) ×4 IMPLANT
ADAPTER SCOPE UROLOK II (MISCELLANEOUS) ×2 IMPLANT
BAG URINE DRAINAGE (UROLOGICAL SUPPLIES) ×2 IMPLANT
BALLN NEPHROSTOMY 10X15 (UROLOGICAL SUPPLIES) ×2 IMPLANT
BASKET ZERO TIP 1.9FR (BASKET) IMPLANT
BLADE SURG 15 STRL LF DISP TIS (BLADE) ×1 IMPLANT
BLADE SURG 15 STRL SS (BLADE) ×1
CATH COUNCIL 22FR (CATHETERS) IMPLANT
CATH FOLEY 2W COUNCIL 20FR 5CC (CATHETERS) IMPLANT
CATH FOLEY 2W COUNCIL 5CC 18FR (CATHETERS) ×2 IMPLANT
CATH STENT KAYE NEPHR TAMP (CATHETERS) IMPLANT
CATH URETL 5X70 OPEN END (CATHETERS) ×2 IMPLANT
CATH/STENT KAYE NEPHR TAMP (CATHETERS)
CHLORAPREP W/TINT 26ML (MISCELLANEOUS) ×4 IMPLANT
CNTNR SPEC 2.5X3XGRAD LEK (MISCELLANEOUS) ×1
CONRAY 43 FOR UROLOGY 50M (MISCELLANEOUS) ×6 IMPLANT
CONT SPEC 4OZ STER OR WHT (MISCELLANEOUS) ×1
CONTAINER SPEC 2.5X3XGRAD LEK (MISCELLANEOUS) ×1 IMPLANT
DRAPE C-ARM XRAY 36X54 (DRAPES) ×2 IMPLANT
DRAPE SHEET LG 3/4 BI-LAMINATE (DRAPES) ×2 IMPLANT
DRAPE SURG 17X11 SM STRL (DRAPES) ×8 IMPLANT
FIBER LASER LITHO 273 (Laser) ×2 IMPLANT
GAUZE SPONGE 4X4 12PLY STRL (GAUZE/BANDAGES/DRESSINGS) ×2 IMPLANT
GLIDEWIRE STIFF .35X180X3 HYDR (WIRE) ×2 IMPLANT
GLOVE BIO SURGEON STRL SZ 6.5 (GLOVE) ×2 IMPLANT
GLOVE BIO SURGEON STRL SZ7 (GLOVE) ×12 IMPLANT
GLOVE BIOGEL PI IND STRL 6.5 (GLOVE) ×2 IMPLANT
GLOVE BIOGEL PI INDICATOR 6.5 (GLOVE) ×2
GOWN STRL REUS W/ TWL LRG LVL3 (GOWN DISPOSABLE) ×4 IMPLANT
GOWN STRL REUS W/TWL LRG LVL3 (GOWN DISPOSABLE) ×4
GUIDEWIRE GREEN .038 145CM (MISCELLANEOUS) IMPLANT
GUIDEWIRE INTRO SET STRAIGHT (WIRE) ×2 IMPLANT
GUIDEWIRE STR ZIPWIRE 035X150 (MISCELLANEOUS) IMPLANT
GUIDEWIRE SUPER STIFF (WIRE) IMPLANT
HOLDER FOLEY CATH W/STRAP (MISCELLANEOUS) ×2 IMPLANT
INTRODUCER DILATOR DOUBLE (INTRODUCER) IMPLANT
MANIFOLD NEPTUNE II (INSTRUMENTS) ×2 IMPLANT
MAT BLUE FLOOR 46X72 FLO (MISCELLANEOUS) ×4 IMPLANT
NDL FASCIA INCISION 18GA (NEEDLE) ×2 IMPLANT
PACK BASIN MINOR ARMC (MISCELLANEOUS) ×2 IMPLANT
PAD ABD DERMACEA PRESS 5X9 (GAUZE/BANDAGES/DRESSINGS) ×4 IMPLANT
PAD PREP 24X41 OB/GYN DISP (PERSONAL CARE ITEMS) ×2 IMPLANT
PROBE CYBERWAND SET (MISCELLANEOUS) ×2 IMPLANT
SENSORWIRE 0.038 NOT ANGLED (WIRE) ×4
SET IRRIGATING DISP (SET/KITS/TRAYS/PACK) ×2 IMPLANT
SHEET NEURO XL SOL CTL (MISCELLANEOUS) ×2 IMPLANT
SOL .9 NS 3000ML IRR  AL (IV SOLUTION) ×15
SOL .9 NS 3000ML IRR UROMATIC (IV SOLUTION) ×15 IMPLANT
SPONGE DRAIN TRACH 4X4 STRL 2S (GAUZE/BANDAGES/DRESSINGS) ×2 IMPLANT
STENT URET 6FRX24 CONTOUR (STENTS) ×2 IMPLANT
STENT URET 6FRX26 CONTOUR (STENTS) IMPLANT
STRAP SAFETY 5IN WIDE (MISCELLANEOUS) ×4 IMPLANT
SUT SILK 0 SH 30 (SUTURE) ×2 IMPLANT
SYR 10ML LL (SYRINGE) ×2 IMPLANT
SYR 20CC LL (SYRINGE) ×2 IMPLANT
SYR 30ML LL (SYRINGE) ×2 IMPLANT
SYRINGE IRR TOOMEY STRL 70CC (SYRINGE) ×2 IMPLANT
TAPE CLOTH 10X20 WHT NS LF (TAPE) ×1 IMPLANT
TAPE CLOTH 2X10 WHT NS LF (TAPE) ×1
TAPE MICROFOAM 4IN (TAPE) ×2 IMPLANT
TRAP SPECIMEN MUCOUS 40CC (MISCELLANEOUS) IMPLANT
TRAY FOLEY MTR SLVR 16FR STAT (SET/KITS/TRAYS/PACK) ×2 IMPLANT
TUBING CONNECTING 10 (TUBING) ×2 IMPLANT
WATER STERILE IRR 1000ML POUR (IV SOLUTION) ×2 IMPLANT
WIRE SENSOR 0.038 NOT ANGLED (WIRE) ×2 IMPLANT

## 2017-11-25 NOTE — OR Nursing (Signed)
Pt transferred to specials via stretcher accompanied by 2 RN's @ 730 Returned from specials via stretcher c/o 5/10 pain on right abdominal area Dr Apolinar JunesBrandon in with pt

## 2017-11-25 NOTE — Op Note (Signed)
Date of procedure: 11/25/17  Preoperative diagnosis:  1. Right staghorn calculus  Postoperative diagnosis:  1. Same as above  Procedure: 1. Right percutaneous nephrolithotomy 2. Right antegrade nephrostogram 3. Right antegrade ureteral stent placement 4. Laser lithotripsy 5. Right percutaneous nephrostomy tube placement  Surgeon: Vanna Scotland, MD  Anesthesia: General  Complications: None  Intraoperative findings: Hard, very large full staghorn calculus involving upper, mid, and lower pole moieties along with renal pelvis extending into the UPJ.  Unable to adequately treat midpole he moiety stone due to angulation of lower pole access.  EBL: 100 cc  Specimens: Stone fragment  Drains: 6 x 24 French double-J ureteral stent on right, 16 French Foley catheter, 18 [place] council tip Foley as nephrostomy tube on right  Indication: Jasmine Henderson is a 42 y.o. patient with initially presenting with a small obstructing left ureteral calculus found to have a full right-sided staghorn.  She returns to the OR today to treat this right-sided stone.  After reviewing the management options for treatment, she elected to proceed with the above surgical procedure(s). We have discussed the potential benefits and risks of the procedure, side effects of the proposed treatment, the likelihood of the patient achieving the goals of the procedure, and any potential problems that might occur during the procedure or recuperation. Informed consent has been obtained.  Description of procedure:  Patient was taken to interventional radiology for right nephroureteral catheter placement which was successful accessing the right lower pole moiety extending down to the bladder.   The patient was then taken to the operating room and general anesthesia was induced.  A Foley catheter was placed using standard sterile conditions. The patient was placed in the prone position, prepped and draped in the usual  sterile fashion, and preoperative antibiotics were administered. A preoperative time-out was performed.   The indwelling nephroureteral stent was cannulated using a Super Stiff wire down to level of the bladder.  The wire was left in place as a working wire and the nephroureteral catheter was removed.  On scout imaging, full staghorn calculus is noted within the right kidney.  The wire was seen skirting the lower aspect of the calculus extending down the UPJ ureter and into the bladder.  A dual-lumen safety wire introducer was used to introduce a second sensor wire down to level of the bladder.  This went easily.  A fascial incisor needle was then used to incise the fascia in a cruciate incision.  A Cook NephroMax balloon was inserted such that the tip touched the lower pole stone.  This was inflated to 18 cm of water.  This allowed to remain in place for several minutes while the nephroscope was assembled.  The sheath was then advanced over the balloon to level the stone.  The balloon was deflated and there was a small amount of bleeding but nothing copious or concerning.  The nephroscope was then inserted into the lower pole the stone was immediately encountered.  Cyber wand was used to break up this very hard stone first within the lower pole and then extending into the renal pelvis, UPJ, and ultimately with some torque, able to access and treat the majority of the upper pole stone.  Forceps were used to extract some of the larger pieces.  Ultimately, an approximate 1.5 cm stone in the extreme upper pole necessitated the use of a flexible cystoscope and the laser fiber using a 273 m laser fiber in the settings of 0.2 J and 40 Hz.  The nephroscope was then reintroduced and the fragmented pieces were aspirated from the collecting system.  I spent quite some time and trying to navigate and find the opening into the midpole moiety but was unsuccessful using the rigid nephroscope.  I was ultimately able to  manipulate a flexible cystoscope to this area but due to poor visualization and extreme nearly 180 degree angulation, I was unable to treat this portion of the stone.  An antegrade nephrostogram was then performed through the flexible cystoscope showing no significant extravasation and creating a roadmap of the kidney.  Ultimately, I decided to go ahead and place a ureteral stent order to help facilitate future ureteroscopy to return residual stone burden.  The third wire was introduced through the scope down to level the bladder.  A 6 x 24 French double-J ureteral stent was advanced to level the bladder.  The wire was partially withdrawn until a partial "was noted within the bladder but clearly crossing the midline.  The wires fully withdrawn and direct visualization confirmed coiling of the stent within the renal pelvis.  An 2818 [place] council tip was then inserted over the remaining sensor wire into the renal pelvis.  The balloon was filled with 2 cc of partially due to diluted contrast.  The safety wire was then withdrawn.  The nephrostomy sheath was backed out over the nephrostomy tube.  An antegrade nephrostogram was performed showing contrast traversing the stent down the ureter, filling all portions of the kidney except for the mid pole moiety where the residual stone burden remained, and no significant contrast extravasation.  Finally, the safety wire was removed under fluoroscopic guidance.  The nephrostomy tube was sutured in place using 06 x 2.  Patient was clean and dry to the dressing of 4 x 4's, ABD pad and foam tape was applied.  The patient was then repositioned in the supine position, reversed from anesthesia, and taken to PACU in stable condition.  Plan: Plan for observation overnight.  We will plan on capping her nephrostomy tube in the morning and then removing her nephrostomy and Foley prior to discharge.  We will obtain a KUB in 2 weeks to assess residual stone burden and plans for treatment  of this midpole stone burden.   Vanna ScotlandAshley Kissy Cielo, M.D.

## 2017-11-25 NOTE — Procedures (Signed)
  Procedure: R nephroureteral catheter placed 282f RLP to bladder EBL:   minimal Complications:  none immediate  See full dictation in [facility] P[facility].  Thora Lance. Fionn Stracke MD Main # [phone] Pager  [phone]

## 2017-11-25 NOTE — Interval H&P Note (Signed)
History and Physical Interval Note:  11/25/2017 9:30 AM  Jasmine Henderson  has presented today for surgery, with the diagnosis of right staghorn calculus  The various methods of treatment have been discussed with the patient and family. After consideration of risks, benefits and other options for treatment, the patient has consented to  Procedure(s): NEPHROLITHOTOMY PERCUTANEOUS (Right) as a surgical intervention .  The patient's history has been reviewed, patient examined, no change in status, stable for surgery.  I have reviewed the patient's chart and labs.  Questions were answered to the patient's satisfaction.    RRR CTAB  Vanna ScotlandAshley Gailyn Crook

## 2017-11-25 NOTE — Transfer of Care (Signed)
Immediate Anesthesia Transfer of Care Note  Patient: Jasmine Henderson  Procedure(s) Performed: NEPHROLITHOTOMY PERCUTANEOUS (Right )  Patient Location: PACU  Anesthesia Type:General  Level of Consciousness: awake and responds to stimulation  Airway & Oxygen Therapy: Patient Spontanous Breathing and Patient connected to face mask oxygen  Post-op Assessment: Report given to RN and Post -op Vital signs reviewed and stable  Post vital signs: Reviewed and stable  Last Vitals:  Vitals Value Taken Time  BP 107/58 11/25/2017  2:59 PM  Temp    Pulse 84 11/25/2017  2:59 PM  Resp 20 11/25/2017  2:59 PM  SpO2 100 % 11/25/2017  2:59 PM    Last Pain:  Vitals:   11/25/17 0643  TempSrc: Oral  PainSc: 0-No pain         Complications: No apparent anesthesia complications

## 2017-11-25 NOTE — Anesthesia Post-op Follow-up Note (Signed)
Anesthesia QCDR form completed.        

## 2017-11-25 NOTE — Anesthesia Preprocedure Evaluation (Signed)
Anesthesia Evaluation  Patient identified by MRN, date of birth, ID band Patient awake    Reviewed: Allergy & Precautions, NPO status , Patient's Chart, lab work & pertinent test results, reviewed documented beta blocker date and time   Airway Mallampati: II  TM Distance: >3 FB     Dental  (+) Chipped   Pulmonary           Cardiovascular      Neuro/Psych    GI/Hepatic   Endo/Other  Hypothyroidism   Renal/GU      Musculoskeletal   Abdominal   Peds  Hematology   Anesthesia Other Findings   Reproductive/Obstetrics                             Anesthesia Physical Anesthesia Plan  ASA: III  Anesthesia Plan: General   Post-op Pain Management:    Induction: Intravenous  PONV Risk Score and Plan:   Airway Management Planned: Oral ETT  Additional Equipment:   Intra-op Plan:   Post-operative Plan:   Informed Consent: I have reviewed the patients History and Physical, chart, labs and discussed the procedure including the risks, benefits and alternatives for the proposed anesthesia with the patient or authorized representative who has indicated his/her understanding and acceptance.     Plan Discussed with: CRNA  Anesthesia Plan Comments:         Anesthesia Quick Evaluation  

## 2017-11-25 NOTE — Anesthesia Procedure Notes (Signed)
Procedure Name: Intubation Performed by: Robbye Dede, CRNA Pre-anesthesia Checklist: Patient identified, Patient being monitored, Timeout performed, Emergency Drugs available and Suction available Patient Re-evaluated:Patient Re-evaluated prior to induction Oxygen Delivery Method: Circle system utilized Preoxygenation: Pre-oxygenation with 100% oxygen Induction Type: IV induction Ventilation: Mask ventilation without difficulty Laryngoscope Size: Mac and 3 Grade View: Grade I Tube type: Oral Tube size: 7.0 mm Number of attempts: 1 Airway Equipment and Method: Stylet Placement Confirmation: ETT inserted through vocal cords under direct vision,  positive ETCO2 and breath sounds checked- equal and bilateral Secured at: 21 cm Tube secured with: Tape Dental Injury: Teeth and Oropharynx as per pre-operative assessment        

## 2017-11-25 NOTE — Anesthesia Postprocedure Evaluation (Signed)
Anesthesia Post Note  Patient: Jasmine Henderson  Procedure(s) Performed: NEPHROLITHOTOMY PERCUTANEOUS (Right )  Patient location during evaluation: PACU Anesthesia Type: General Level of consciousness: awake and alert Pain management: pain level controlled Vital Signs Assessment: post-procedure vital signs reviewed and stable Respiratory status: spontaneous breathing, nonlabored ventilation, respiratory function stable and patient connected to nasal cannula oxygen Cardiovascular status: blood pressure returned to baseline and stable Postop Assessment: no apparent nausea or vomiting Anesthetic complications: no     Last Vitals:  Vitals:   11/25/17 1548 11/25/17 1615  BP:  (!) 116/56  Pulse: 66 70  Resp: 13 17  Temp: 36.6 C 36.6 C  SpO2: 96% 98%    Last Pain:  Vitals:   11/25/17 1616  TempSrc:   PainSc: 3                  Leeta Grimme S

## 2017-11-26 ENCOUNTER — Encounter: Payer: Self-pay | Admitting: Urology

## 2017-11-26 LAB — BASIC METABOLIC PANEL
ANION GAP: 8 (ref 5–15)
BUN: 8 mg/dL (ref 6–20)
CO2: 20 mmol/L — AB (ref 22–32)
CREATININE: 0.61 mg/dL (ref 0.44–1.00)
Calcium: 7.6 mg/dL — ABNORMAL LOW (ref 8.9–10.3)
Chloride: 109 mmol/L (ref 101–111)
GFR calc non Af Amer: >60 mL/min (ref >60)
GLUCOSE: 145 mg/dL — AB (ref 65–99)
Potassium: 3.6 mmol/L (ref 3.5–5.1)
Sodium: 137 mmol/L (ref 135–145)

## 2017-11-26 LAB — CBC
HEMATOCRIT: 31.1 % — AB (ref 35.0–47.0)
HEMOGLOBIN: 11.1 g/dL — AB (ref 12.0–16.0)
MCH: 31.6 pg (ref 26.0–34.0)
MCHC: 35.7 g/dL (ref 32.0–36.0)
MCV: 88.4 fL (ref 80.0–100.0)
Platelets: 214 10*3/uL (ref 150–440)
RBC: 3.52 MIL/uL — ABNORMAL LOW (ref 3.80–5.20)
RDW: 13.2 % (ref 11.5–14.5)
WBC: 14.3 10*3/uL — ABNORMAL HIGH (ref 3.6–11.0)

## 2017-11-26 MED ORDER — OXYBUTYNIN CHLORIDE 5 MG PO TABS: 5.0000 mg | ORAL_TABLET | Freq: Three times a day (TID) | ORAL | 0 refills | Status: DC | PRN

## 2017-11-26 MED ORDER — OXYCODONE-ACETAMINOPHEN 5-325 MG PO TABS: 1.0000 | ORAL_TABLET | ORAL | 0 refills | Status: DC | PRN

## 2017-11-26 MED ORDER — TAMSULOSIN HCL 0.4 MG PO CAPS
0.4000 mg | ORAL_CAPSULE | Freq: Every day | ORAL | Status: DC
  Administered 2017-11-26: 0.4 mg via ORAL
  Filled 2017-11-26: qty 1

## 2017-11-26 MED ORDER — TAMSULOSIN HCL 0.4 MG PO CAPS: 0.4000 mg | ORAL_CAPSULE | Freq: Every day | ORAL | 0 refills | Status: DC

## 2017-11-26 MED ORDER — DOCUSATE SODIUM 100 MG PO CAPS: 100.0000 mg | ORAL_CAPSULE | Freq: Two times a day (BID) | ORAL | 0 refills | Status: DC

## 2017-11-26 MED ORDER — ACETAMINOPHEN 10 MG/ML IV SOLN
INTRAVENOUS | Status: AC
  Filled 2017-11-26: qty 100

## 2017-11-26 MED ORDER — KETOROLAC TROMETHAMINE 15 MG/ML IJ SOLN: 15.0000 mg | Freq: Three times a day (TID) | INTRAMUSCULAR | Status: DC | PRN

## 2017-11-26 MED ORDER — FENTANYL CITRATE (PF) 100 MCG/2ML IJ SOLN
INTRAMUSCULAR | Status: AC
  Filled 2017-11-26: qty 2

## 2017-11-26 NOTE — Discharge Summary (Signed)
Date of admission: 11/25/2017  Date of discharge: 11/26/2017  Admission diagnosis: Right staghorn calculus  Discharge diagnosis: same  Secondary diagnoses:  Patient Active Problem List   Diagnosis Date Noted  . 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 Hypothyroidism 09/03/2017  . 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 Briefly, Jasmine Henderson is a 42 y.o. year old patient with right staghorn calculus status post uncomplicated right PCNL.  Hospital Course: Patient tolerated the procedure well.  She was then transferred to the floor after an uneventful PACU stay.  Her hospital course was uncomplicated.  On POD#1 she had met discharge criteria: was eating a regular diet, was up and ambulating independently,  pain was well controlled, was voiding without a catheter, and was ready to for discharge.  Her nephrostomy tube was clean for several hours and ultimately removed without complication.  She has an indwelling stent.  Laboratory values:  Recent Labs    11/26/17 0413  WBC 14.3*  HGB 11.1*  HCT 31.1*   Recent Labs    11/26/17 0413  NA 137  K 3.6  CL 109  CO2 20*  GLUCOSE 145*  BUN 8  CREATININE 0.61  CALCIUM 7.6*   No results for input(s): LABPT, INR in the last 72 hours. No results for input(s): LABURIN in the last 72 hours. Results for orders placed or performed during the hospital encounter of 11/18/17  Urine culture     Status: None   Collection Time: 11/18/17 12:30 PM  Result Value Ref Range Status   Specimen Description   Final    URINE, CLEAN CATCH Performed at [facility], [address], [place], [place] [address]    Special Requests   Final    NONE Performed at [facility], [address] [place], [place] [address]    Culture   Final    NO GROWTH Performed at [facility], [place] [address],  [place], [place] [address]    Report Status 11/19/2017 FINAL  Final    Disposition: Home  Discharge instruction: You have a ureteral stent in place.  This is a tube that extends from your kidney to your bladder.  This may cause urinary bleeding, burning with urination, and urinary frequency.  Please call our office or present to the ED if you develop fevers >101 or pain which is not able to be controlled with oral pain medications.  You may be given either Flomax and/ or ditropan to help with bladder spasms and stent pain in addition to pain medications.     [facility] [address] [place], [place] [address] [phone]   Discharge medications:  Allergies as of 11/26/2017   No Known Allergies     Medication List    TAKE these medications   acetaminophen 650 MG CR tablet Commonly known as:  TYLENOL Take 650 mg by mouth every 8 (eight) hours as needed for pain.   docusate sodium 100 MG capsule Commonly known as:  COLACE Take 1 capsule (100 mg total) by mouth 2 (two) times daily.   levothyroxine 100 MCG tablet Commonly known as:  SYNTHROID, LEVOTHROID Take 100 mcg by mouth daily before breakfast.   oxybutynin 5 MG tablet Commonly known as:  DITROPAN Take 1 tablet (5 mg total) by mouth every 8 (eight) hours as needed for bladder spasms.   oxyCODONE-acetaminophen 5-325 MG  tablet Commonly known as:  PERCOCET Take 1-2 tablets by mouth every 4 (four) hours as needed for moderate pain or severe pain.   tamsulosin 0.4 MG Caps capsule Commonly known as:  FLOMAX Take 1 capsule (0.4 mg total) by mouth daily.       Followup:  Follow-up Information    Hollice Espy, MD. Go on 12/10/2017.   Specialty:  Urology Why:  _0 :30am PLEASE ARRIVE _1 :15A  discuss ureteroscopy Contact information: [address] [place] [id number] [phone]

## 2017-11-26 NOTE — Progress Notes (Signed)
Pt discharged per MD order. IV removed. Discharge instructions explained to pt. All questions answered to pt satisfaction. Prescriptions given to pt. Pt taken downstairs in wheelchair by staff.

## 2017-11-26 NOTE — Progress Notes (Signed)
1 Day Post-Op Subjective: Nausea with emesis overnight.  Improving this a.m.  Complaining of lower abdominal pain radiating to her right kidney.  Foley out, voiding.  Nephrostomy tube clamped for several hours without worsening of pain.  Objective: Vital signs in last 24 hours: Temp:  [97.5 F (36.4 C)-98.2 F (36.8 C)] 98 F (36.7 C) (06/11 1147) Pulse Rate:  [66-84] 73 (06/11 1147) Resp:  [9-21] 20 (06/11 0407) BP: (102-116)/(56-74) 109/70 (06/11 1147) SpO2:  [96 %-100 %] 97 % (06/11 1147)  Intake/Output from previous day: 06/10 0701 - 06/11 0700 In: 2915.6 [P.O.:480; I.V.:2335.6; IV Piggyback:100] Out: 2515 [Urine:2500; Blood:15] Intake/Output this shift: Total I/O In: 240 [P.O.:240] Out: -   Physical Exam:  General: Alert and oriented. CV: No clubbing cyanosis  Lungs: No increased work of breathing or respiratory distress. GI: Soft, Nondistended. Incisions: Right nephrostomy tube removed at bedside, incision clean without surrounding erythema, drainage of clear yellow urine appreciated prior to dressing replacement. Extremities: Nontender, no erythema, no edema.  Lab Results: Recent Labs    11/26/17 0413  HGB 11.1*  HCT 31.1*          Recent Labs    11/26/17 0413  CREATININE 0.61           Results for orders placed or performed during the hospital encounter of 11/25/17 (from the past 24 hour(s))  Glucose, capillary     Status: Abnormal   Collection Time: 11/25/17  3:02 PM  Result Value Ref Range   Glucose-Capillary 138 (H) 65 - 99 mg/dL  CBC     Status: Abnormal   Collection Time: 11/26/17  4:13 AM  Result Value Ref Range   WBC 14.3 (H) 3.6 - 11.0 K/uL   RBC 3.52 (L) 3.80 - 5.20 MIL/uL   Hemoglobin 11.1 (L) 12.0 - 16.0 g/dL   HCT 16.131.1 (L) 09.635.0 - 04.547.0 %   MCV 88.4 80.0 - 100.0 fL   MCH 31.6 26.0 - 34.0 pg   MCHC 35.7 32.0 - 36.0 g/dL   RDW 40.913.2 81.111.5 - 91.414.5 %   Platelets 214 150 - 440 K/uL  Basic metabolic panel     Status: Abnormal   Collection  Time: 11/26/17  4:13 AM  Result Value Ref Range   Sodium 137 135 - 145 mmol/L   Potassium 3.6 3.5 - 5.1 mmol/L   Chloride 109 101 - 111 mmol/L   CO2 20 (L) 22 - 32 mmol/L   Glucose, Bld 145 (H) 65 - 99 mg/dL   BUN 8 6 - 20 mg/dL   Creatinine, Ser 7.820.61 0.44 - 1.00 mg/dL   Calcium 7.6 (L) 8.9 - 10.3 mg/dL   GFR calc non Af Amer >60 >60 mL/min   GFR calc Af Amer >60 >60 mL/min   Anion gap 8 5 - 15    Assessment/Plan: POD# 1 s/p R PCNL  1) Ambulate, Incentive spirometry 2) Advance diet as tolerated 3) Transition to oral pain medication 4) D/C Foley 5) nephrostomy removed  Anticipate discharge later today depending on clinical status versus tomorrow.  Vanna ScotlandAshley Elwyn Klosinski, MD   LOS: 0 days   Vanna ScotlandAshley Ifrah Vest 11/26/2017, 1:33 PM

## 2017-11-27 ENCOUNTER — Other Ambulatory Visit: Payer: Self-pay | Admitting: Radiology

## 2017-11-27 ENCOUNTER — Telehealth: Payer: Self-pay | Admitting: Radiology

## 2017-11-27 DIAGNOSIS — N200 Calculus of kidney: Secondary | ICD-10-CM

## 2017-11-27 NOTE — Telephone Encounter (Signed)
Interpreter Hinda Lenisugustine [phone] [facility].

## 2017-11-27 NOTE — Telephone Encounter (Signed)
LMOM to return call regarding patient's upcoming surgery.

## 2017-11-27 NOTE — Telephone Encounter (Signed)
Patient called with assistance of Jacqui in interpreter services. Notified patient of upcoming appointments in office & plan to schedule surgery on 12/18/2017. Instructions given. Patient voices understanding.

## 2017-11-28 LAB — STONE ANALYSIS
Ammonium Acid Urate: 15 %
CA OXALATE, MONOHYDR.: 3 %
Ca phos cry stone ql IR: 52 %
Magnesium Ammon Phos: 30 %
Stone Weight KSTONE: 1892.8 mg

## 2017-12-10 ENCOUNTER — Ambulatory Visit: Payer: Self-pay | Admitting: Urology

## 2017-12-12 ENCOUNTER — Encounter: Payer: Self-pay | Admitting: Urology

## 2017-12-12 ENCOUNTER — Ambulatory Visit (INDEPENDENT_AMBULATORY_CARE_PROVIDER_SITE_OTHER): Payer: Self-pay | Admitting: Urology

## 2017-12-12 VITALS — BP 126/84 | HR 89 | Ht 62.0 in | Wt 153.0 lb

## 2017-12-12 DIAGNOSIS — N200 Calculus of kidney: Secondary | ICD-10-CM

## 2017-12-12 LAB — URINALYSIS, COMPLETE
Bilirubin, UA: NEGATIVE
Glucose, UA: NEGATIVE
Ketones, UA: NEGATIVE
Nitrite, UA: NEGATIVE
PROTEIN UA: NEGATIVE
Specific Gravity, UA: 1.015 (ref 1.005–1.030)
UUROB: 0.2 mg/dL (ref 0.2–1.0)
pH, UA: 5.5 (ref 5.0–7.5)

## 2017-12-12 LAB — MICROSCOPIC EXAMINATION

## 2017-12-12 NOTE — Progress Notes (Signed)
 12/12/2017 9:24 AM   Jasmine Henderson 01/13/1976 [id number]  Referring provider: Center, Jasmine Henderson [address] [place], [place] [address]  Chief Complaint  Patient presents with  . Nephrolithiasis    discuss staging    HPI: 41-year-old female returns today following right PCNL to discuss staged ureteroscopy.  She initially presented with a full staghorn calculus as well as a contralateral ureteral stone.  She underwent ureteroscopy of right PCNL on 11/25/2017 which was otherwise uncomplicated.  Approximately 90% of her stone burden was treated but due to access issues, her pole calyx stone was left untreated.  She currently has a double-J ureteral stent in place.  She reports today that she has flank pain with voiding and urinary urgency.  No fevers or dysuria.  She is otherwise doing well.  Her incision stopped draining on postop day 2.  It is now well-healed.   PMH: Past Medical History:  Diagnosis Date  . History of kidney stones   . Hypothyroidism   . Pre-diabetes   . Seizures (HCC)    pt states she had dizzy spell, passed out and was seizing. unknown reason. NO treatment. neurologist could not find anything  . Thyroid disease     Surgical History: Past Surgical History:  Procedure Laterality Date  . CYSTOSCOPY/URETEROSCOPY/HOLMIUM LASER/STENT PLACEMENT Left 09/24/2017   Procedure: CYSTOSCOPY/URETEROSCOPY/left retrorade pyleogram;  Surgeon: Stoioff, Scott C, MD;  Location: [facility] ORS;  Service: Urology;  Laterality: Left;  . IR NEPHROSTOMY PLACEMENT RIGHT  11/25/2017  . NEPHROLITHOTOMY Right 11/25/2017   Procedure: NEPHROLITHOTOMY PERCUTANEOUS;  Surgeon: Krrish Freund, MD;  Location: [facility];  Service: Urology;  Laterality: Right;    Home Medications:  Allergies as of 12/12/2017   No Known Allergies     Medication List        Accurate as of 12/12/17  9:24 AM. Always use your most recent med list.          acetaminophen  650 MG CR tablet Commonly known as:  TYLENOL Take 650 mg by mouth every 8 (eight) hours as needed for pain.   docusate sodium 100 MG capsule Commonly known as:  COLACE Take 1 capsule (100 mg total) by mouth 2 (two) times daily.   levothyroxine 100 MCG tablet Commonly known as:  SYNTHROID, LEVOTHROID Take 100 mcg by mouth daily before breakfast.   oxybutynin 5 MG tablet Commonly known as:  DITROPAN Take 1 tablet (5 mg total) by mouth every 8 (eight) hours as needed for bladder spasms.   tamsulosin 0.4 MG Caps capsule Commonly known as:  FLOMAX Take 1 capsule (0.4 mg total) by mouth daily.       Allergies: No Known Allergies  Family History: Family History  Problem Relation Age of Onset  . Bladder Cancer Neg Hx   . Kidney cancer Neg Hx     Social History:  reports that she has never smoked. She has never used smokeless tobacco. She reports that she does not drink alcohol or use drugs.  ROS: UROLOGY Frequent Urination?: Yes Hard to postpone urination?: No Burning/pain with urination?: No Get up at night to urinate?: No Leakage of urine?: No Urine stream starts and stops?: Yes Trouble starting stream?: No Do you have to strain to urinate?: Yes Blood in urine?: No Urinary tract infection?: No Sexually transmitted disease?: No Injury to kidneys or bladder?: No Painful intercourse?: No Weak stream?: No Currently pregnant?: No Vaginal bleeding?: No Last menstrual period?: n  Gastrointestinal Nausea?: No Vomiting?:   No Indigestion/heartburn?: No Diarrhea?: No Constipation?: Yes  Constitutional Fever: No Night sweats?: No Weight loss?: No Fatigue?: No  Skin Skin rash/lesions?: No Itching?: No  Eyes Blurred vision?: No Double vision?: No  Ears/Nose/Throat Sore throat?: No Sinus problems?: No  Hematologic/Lymphatic Swollen glands?: No Easy bruising?: No  Cardiovascular Leg swelling?: No Chest pain?: No  Respiratory Cough?: No Shortness of  breath?: No  Endocrine Excessive thirst?: No  Musculoskeletal Back pain?: No Joint pain?: No  Neurological Headaches?: No Dizziness?: No  Psychologic Depression?: No Anxiety?: No  Physical Exam: BP 126/84   Pulse 89   Ht 5' 2" (1.575 m)   Wt 153 lb (69.4 kg)   BMI 27.98 kg/m   Constitutional:  Alert and oriented, No acute distress.  Accompanied by Jasmine Henderson, Spanish translator. HEENT: [place] AT, moist mucus membranes.  Trachea midline, no masses. Cardiovascular: No clubbing, cyanosis, or edema. Respiratory: Normal respiratory effort, no increased work of breathing. GI: Abdomen is soft, nontender, nondistended, no abdominal masses GU: No CVA tenderness. Approximately 1 cm well-healed without surrounding erythema.  Right flank incision. Skin: No rashes, bruises or suspicious lesions. Neurologic: Grossly intact, no focal deficits, moving all 4 extremities. Psychiatric: Normal mood and affect.   Laboratory Data: Lab Results  Component Value Date   WBC 14.3 (H) 11/26/2017   HGB 11.1 (L) 11/26/2017   HCT 31.1 (L) 11/26/2017   MCV 88.4 11/26/2017   PLT 214 11/26/2017    Lab Results  Component Value Date   CREATININE 0.61 11/26/2017    Urinalysis UA / UCx pending  Pertinent Imaging: Intraoperative fluoroscopy pictures from 11/25/2016 was personally reviewed today again today as well as with the patient.  Assessment & Plan:    1. Staghorn calculus Complete right staghorn calculus status post right PCNL Residual mid calyx stone burden I recommended returning to the operating room next week for staged ureteroscopy to treat her residual stone burden Stone analysis reviewed, consistent with infection stone Preop UA/urine culture Risks and benefits of ureteroscopy were reviewed including but not limited to infection, bleeding, pain, ureteral injury which could require open surgery versus prolonged indwelling if ureteral perforation occurs, persistent stone disease, requirement  for staged procedure, possible stent, and global anesthesia risks. Patient expressed understanding and desires to proceed with ureteroscopy. - Urinalysis, Complete - CULTURE, URINE COMPREHENSIVE  Jasmine Sykora, MD  [facility] [address], [address] [place], [place] [address] [phone]  

## 2017-12-12 NOTE — H&P (View-Only) (Signed)
12/12/2017 9:24 AM   Jasmine Henderson 1976-06-06 [id number]  Referring provider: Center, [facility] [address] [place], [place] [address]  Chief Complaint  Patient presents with  . Nephrolithiasis    discuss staging    HPI: 42 year old female returns today following right PCNL to discuss staged ureteroscopy.  She initially presented with a full staghorn calculus as well as a contralateral ureteral stone.  She underwent ureteroscopy of right PCNL on 11/25/2017 which was otherwise uncomplicated.  Approximately 90% of her stone burden was treated but due to access issues, her pole calyx stone was left untreated.  She currently has a double-J ureteral stent in place.  She reports today that she has flank pain with voiding and urinary urgency.  No fevers or dysuria.  She is otherwise doing well.  Her incision stopped draining on postop day 2.  It is now well-healed.   PMH: Past Medical History:  Diagnosis Date  . History of kidney stones   . Hypothyroidism   . Pre-diabetes   . Seizures (HCC)    pt states she had dizzy spell, passed out and was seizing. unknown reason. NO treatment. neurologist could not find anything  . Thyroid disease     Surgical History: Past Surgical History:  Procedure Laterality Date  . CYSTOSCOPY/URETEROSCOPY/HOLMIUM LASER/STENT PLACEMENT Left 09/24/2017   Procedure: CYSTOSCOPY/URETEROSCOPY/left retrorade pyleogram;  Surgeon: Riki Altes, MD;  Location: [facility];  Service: Urology;  Laterality: Left;  . IR NEPHROSTOMY PLACEMENT RIGHT  11/25/2017  . NEPHROLITHOTOMY Right 11/25/2017   Procedure: NEPHROLITHOTOMY PERCUTANEOUS;  Surgeon: Vanna Scotland, MD;  Location: [facility];  Service: Urology;  Laterality: Right;    Home Medications:  Allergies as of 12/12/2017   No Known Allergies     Medication List        Accurate as of 12/12/17  9:24 AM. Always use your most recent med list.          acetaminophen  650 MG CR tablet Commonly known as:  TYLENOL Take 650 mg by mouth every 8 (eight) hours as needed for pain.   docusate sodium 100 MG capsule Commonly known as:  COLACE Take 1 capsule (100 mg total) by mouth 2 (two) times daily.   levothyroxine 100 MCG tablet Commonly known as:  SYNTHROID, LEVOTHROID Take 100 mcg by mouth daily before breakfast.   oxybutynin 5 MG tablet Commonly known as:  DITROPAN Take 1 tablet (5 mg total) by mouth every 8 (eight) hours as needed for bladder spasms.   tamsulosin 0.4 MG Caps capsule Commonly known as:  FLOMAX Take 1 capsule (0.4 mg total) by mouth daily.       Allergies: No Known Allergies  Family History: Family History  Problem Relation Age of Onset  . Bladder Cancer Neg Hx   . Kidney cancer Neg Hx     Social History:  reports that she has never smoked. She has never used smokeless tobacco. She reports that she does not drink alcohol or use drugs.  ROS: UROLOGY Frequent Urination?: Yes Hard to postpone urination?: No Burning/pain with urination?: No Get up at night to urinate?: No Leakage of urine?: No Urine stream starts and stops?: Yes Trouble starting stream?: No Do you have to strain to urinate?: Yes Blood in urine?: No Urinary tract infection?: No Sexually transmitted disease?: No Injury to kidneys or bladder?: No Painful intercourse?: No Weak stream?: No Currently pregnant?: No Vaginal bleeding?: No Last menstrual period?: n  Gastrointestinal Nausea?: No Vomiting?:  No Indigestion/heartburn?: No Diarrhea?: No Constipation?: Yes  Constitutional Fever: No Night sweats?: No Weight loss?: No Fatigue?: No  Skin Skin rash/lesions?: No Itching?: No  Eyes Blurred vision?: No Double vision?: No  Ears/Nose/Throat Sore throat?: No Sinus problems?: No  Hematologic/Lymphatic Swollen glands?: No Easy bruising?: No  Cardiovascular Leg swelling?: No Chest pain?: No  Respiratory Cough?: No Shortness of  breath?: No  Endocrine Excessive thirst?: No  Musculoskeletal Back pain?: No Joint pain?: No  Neurological Headaches?: No Dizziness?: No  Psychologic Depression?: No Anxiety?: No  Physical Exam: BP 126/84   Pulse 89   Ht 5\' 2"  (1.575 m)   Wt 153 lb (69.4 kg)   BMI 27.98 kg/m   Constitutional:  Alert and oriented, No acute distress.  Accompanied by Natale Laytto, Spanish translator. HEENT: [place] AT, moist mucus membranes.  Trachea midline, no masses. Cardiovascular: No clubbing, cyanosis, or edema. Respiratory: Normal respiratory effort, no increased work of breathing. GI: Abdomen is soft, nontender, nondistended, no abdominal masses GU: No CVA tenderness. Approximately 1 cm well-healed without surrounding erythema.  Right flank incision. Skin: No rashes, bruises or suspicious lesions. Neurologic: Grossly intact, no focal deficits, moving all 4 extremities. Psychiatric: Normal mood and affect.   Laboratory Data: Lab Results  Component Value Date   WBC 14.3 (H) 11/26/2017   HGB 11.1 (L) 11/26/2017   HCT 31.1 (L) 11/26/2017   MCV 88.4 11/26/2017   PLT 214 11/26/2017    Lab Results  Component Value Date   CREATININE 0.61 11/26/2017    Urinalysis UA / UCx pending  Pertinent Imaging: Intraoperative fluoroscopy pictures from 11/25/2016 was personally reviewed today again today as well as with the patient.  Assessment & Plan:    1. Staghorn calculus Complete right staghorn calculus status post right PCNL Residual mid calyx stone burden I recommended returning to the operating room next week for staged ureteroscopy to treat her residual stone burden Stone analysis reviewed, consistent with infection stone Preop UA/urine culture Risks and benefits of ureteroscopy were reviewed including but not limited to infection, bleeding, pain, ureteral injury which could require open surgery versus prolonged indwelling if ureteral perforation occurs, persistent stone disease, requirement  for staged procedure, possible stent, and global anesthesia risks. Patient expressed understanding and desires to proceed with ureteroscopy. - Urinalysis, Complete - CULTURE, URINE COMPREHENSIVE  Vanna ScotlandAshley Kerith Sherley, MD  [facility] [address], [address] [place], [place] [address] [phone]

## 2017-12-15 LAB — CULTURE, URINE COMPREHENSIVE

## 2017-12-16 NOTE — Patient Instructions (Addendum)
Your procedure is scheduled on:  Report to Same Day Surgery 2nd floor medical mall [facility](Medical Mall Entrance-take elevator on left to 2nd floor.  Check in with surgery information desk.) To find out your arrival time please call [phone] between 1PM - 3PM on   Remember: Instructions that are not followed completely may result in serious medical risk, up to and including death, or upon the discretion of your surgeon and anesthesiologist your surgery may need to be rescheduled.    _x___ 1. Do not eat food after midnight the night before your procedure. You may drink clear liquids up to 2 hours before you are scheduled to arrive at the hospital for your procedure.  Do not drink clear liquids within 2 hours of your scheduled arrival to the hospital.  Clear liquids include  --Water or Apple juice without pulp  --Clear carbohydrate beverage such as ClearFast or Gatorade  --Black Coffee or Clear Tea (No milk, no creamers, do not add anything to                  the coffee or Tea Type 1 and type 2 diabetics should only drink water.  No gum chewing or hard candies.     __x__ 2. No Alcohol for 24 hours before or after surgery.   __x__3. No Smoking or e-cigarettes for 24 prior to surgery.  Do not use any chewable tobacco products for at least 6 hour prior to surgery   ____  4. Bring all medications with you on the day of surgery if instructed.    __x__ 5. Notify your doctor if there is any change in your medical condition     (cold, fever, infections).    x___6. On the morning of surgery brush your teeth with toothpaste and water.  You may rinse your mouth with mouth wash if you wish.  Do not swallow any toothpaste or mouthwash.   Do not wear jewelry, make-up, hairpins, clips or nail polish.  Do not wear lotions, powders, or perfumes. You may wear deodorant.  Do not shave 48 hours prior to surgery. Men may shave face and neck.  Do not bring valuables to the hospital.    [facility] is not  responsible for any belongings or valuables.               Contacts, dentures or bridgework may not be worn into surgery.  Leave your suitcase in the car. After surgery it may be brought to your room.  For patients admitted to the hospital, discharge time is determined by your                       treatment team.  _  Patients discharged the day of surgery will not be allowed to drive home.  You will need someone to drive you home and stay with you the night of your procedure.    Please read over the following fact sheets that you were given:   [facility] Preparing for [facility] and or MRSA Information   _x___ Take anti-hypertensive listed below, cardiac, seizure, asthma,     anti-reflux and psychiatric medicines. These include:  1. levothyroxine (SYNTHROID, LEVOTHROID) 100 MCG tablet  2.tamsulosin (FLOMAX) 0.4 MG CAPS capsule  3.  4.  5.  6.  ____Fleets enema or Magnesium Citrate as directed.   _x___ Use CHG Soap or sage wipes as directed on instruction sheet   ____ Use inhalers on the day of surgery  and bring to hospital day of surgery  ____ Stop Metformin and Janumet 2 days prior to surgery.    ____ Take 1/2 of usual insulin dose the night before surgery and none on the morning     surgery.   _x___ Follow recommendations from Cardiologist, Pulmonologist or PCP regarding          stopping Aspirin, Coumadin, Plavix ,Eliquis, Effient, or Pradaxa, and Pletal.  X____Stop Anti-inflammatories such as Advil, Aleve, Ibuprofen, Motrin, Naproxen, Naprosyn, Goodies powders or aspirin products. OK to take Tylenol and                          Celebrex.   _x___ Stop supplements until after surgery.  But may continue Vitamin D, Vitamin B,       and multivitamin.   ____ Bring C-Pap to the hospital.

## 2017-12-17 MED ORDER — CEFAZOLIN SODIUM-DEXTROSE 2-4 GM/100ML-% IV SOLN
2.0000 g | INTRAVENOUS | Status: AC
  Administered 2017-12-18: 2 g via INTRAVENOUS

## 2017-12-18 ENCOUNTER — Ambulatory Visit: Payer: Self-pay | Admitting: Anesthesiology

## 2017-12-18 ENCOUNTER — Ambulatory Visit
Admission: RE | Admit: 2017-12-18 | Discharge: 2017-12-18 | Disposition: A | Discharge status: Home / Self Care | Payer: Self-pay | Source: Ambulatory Visit | Attending: Urology | Admitting: Urology

## 2017-12-18 ENCOUNTER — Encounter: Payer: Self-pay | Admitting: Anesthesiology

## 2017-12-18 ENCOUNTER — Encounter
Admission: RE | Disposition: A | Discharge status: Home / Self Care | Payer: Self-pay | Source: Ambulatory Visit | Attending: Urology

## 2017-12-18 ENCOUNTER — Other Ambulatory Visit: Payer: Self-pay

## 2017-12-18 DIAGNOSIS — E039 Hypothyroidism, unspecified: Secondary | ICD-10-CM | POA: Insufficient documentation

## 2017-12-18 DIAGNOSIS — N200 Calculus of kidney: Secondary | ICD-10-CM

## 2017-12-18 DIAGNOSIS — Z79899 Other long term (current) drug therapy: Secondary | ICD-10-CM | POA: Insufficient documentation

## 2017-12-18 DIAGNOSIS — N202 Calculus of kidney with calculus of ureter: Secondary | ICD-10-CM | POA: Insufficient documentation

## 2017-12-18 DIAGNOSIS — R7303 Prediabetes: Secondary | ICD-10-CM | POA: Insufficient documentation

## 2017-12-18 HISTORY — PX: CYSTOSCOPY/URETEROSCOPY/HOLMIUM LASER/STENT PLACEMENT: SHX6546

## 2017-12-18 LAB — GLUCOSE, CAPILLARY
GLUCOSE-CAPILLARY: 103 mg/dL — AB (ref 70–99)
Glucose-Capillary: 118 mg/dL — ABNORMAL HIGH (ref 70–99)

## 2017-12-18 LAB — POCT PREGNANCY, URINE: Preg Test, Ur: NEGATIVE

## 2017-12-18 SURGERY — CYSTOSCOPY/URETEROSCOPY/HOLMIUM LASER/STENT PLACEMENT
Anesthesia: General | Site: Ureter | Laterality: Right | Wound class: Clean Contaminated

## 2017-12-18 MED ORDER — DEXAMETHASONE SODIUM PHOSPHATE 10 MG/ML IJ SOLN
INTRAMUSCULAR | Status: DC | PRN
  Administered 2017-12-18: 5 mg via INTRAVENOUS

## 2017-12-18 MED ORDER — SUCCINYLCHOLINE CHLORIDE 20 MG/ML IJ SOLN
INTRAMUSCULAR | Status: DC | PRN
  Administered 2017-12-18: 100 mg via INTRAVENOUS

## 2017-12-18 MED ORDER — CEFAZOLIN SODIUM-DEXTROSE 2-4 GM/100ML-% IV SOLN
INTRAVENOUS | Status: AC
  Filled 2017-12-18: qty 100

## 2017-12-18 MED ORDER — SEVOFLURANE IN SOLN
RESPIRATORY_TRACT | Status: AC
  Filled 2017-12-18: qty 250

## 2017-12-18 MED ORDER — ROCURONIUM BROMIDE 100 MG/10ML IV SOLN
INTRAVENOUS | Status: DC | PRN
  Administered 2017-12-18: 10 mg via INTRAVENOUS
  Administered 2017-12-18: 20 mg via INTRAVENOUS
  Administered 2017-12-18: 5 mg via INTRAVENOUS
  Administered 2017-12-18 (×2): 10 mg via INTRAVENOUS
  Administered 2017-12-18: 15 mg via INTRAVENOUS
  Administered 2017-12-18 (×2): 10 mg via INTRAVENOUS

## 2017-12-18 MED ORDER — FENTANYL CITRATE (PF) 100 MCG/2ML IJ SOLN
INTRAMUSCULAR | Status: DC | PRN
  Administered 2017-12-18 (×3): 50 ug via INTRAVENOUS

## 2017-12-18 MED ORDER — MIDAZOLAM HCL 5 MG/5ML IJ SOLN
INTRAMUSCULAR | Status: DC | PRN
  Administered 2017-12-18: 2 mg via INTRAVENOUS

## 2017-12-18 MED ORDER — EPHEDRINE SULFATE 50 MG/ML IJ SOLN
INTRAMUSCULAR | Status: DC | PRN
  Administered 2017-12-18: 5 mg via INTRAVENOUS

## 2017-12-18 MED ORDER — DEXAMETHASONE SODIUM PHOSPHATE 10 MG/ML IJ SOLN
INTRAMUSCULAR | Status: AC
  Filled 2017-12-18: qty 1

## 2017-12-18 MED ORDER — TAMSULOSIN HCL 0.4 MG PO CAPS: 0.4000 mg | ORAL_CAPSULE | Freq: Every day | ORAL | 0 refills | Status: DC

## 2017-12-18 MED ORDER — ONDANSETRON HCL 4 MG/2ML IJ SOLN
INTRAMUSCULAR | Status: AC
  Filled 2017-12-18: qty 2

## 2017-12-18 MED ORDER — ONDANSETRON HCL 4 MG/2ML IJ SOLN
4.0000 mg | Freq: Once | INTRAMUSCULAR | Status: AC | PRN
  Administered 2017-12-18: 4 mg via INTRAVENOUS

## 2017-12-18 MED ORDER — FAMOTIDINE 20 MG PO TABS
20.0000 mg | ORAL_TABLET | Freq: Once | ORAL | Status: AC
  Administered 2017-12-18: 20 mg via ORAL

## 2017-12-18 MED ORDER — SUGAMMADEX SODIUM 200 MG/2ML IV SOLN
INTRAVENOUS | Status: AC
  Filled 2017-12-18: qty 2

## 2017-12-18 MED ORDER — ONDANSETRON HCL 4 MG/2ML IJ SOLN
INTRAMUSCULAR | Status: DC | PRN
  Administered 2017-12-18: 4 mg via INTRAVENOUS

## 2017-12-18 MED ORDER — PHENYLEPHRINE HCL 10 MG/ML IJ SOLN
INTRAMUSCULAR | Status: DC | PRN
  Administered 2017-12-18 (×3): 50 ug via INTRAVENOUS
  Administered 2017-12-18: 100 ug via INTRAVENOUS
  Administered 2017-12-18: 150 ug via INTRAVENOUS

## 2017-12-18 MED ORDER — PROPOFOL 10 MG/ML IV BOLUS
INTRAVENOUS | Status: DC | PRN
  Administered 2017-12-18: 30 mg via INTRAVENOUS
  Administered 2017-12-18: 170 mg via INTRAVENOUS

## 2017-12-18 MED ORDER — PROPOFOL 10 MG/ML IV BOLUS
INTRAVENOUS | Status: AC
  Filled 2017-12-18: qty 20

## 2017-12-18 MED ORDER — FENTANYL CITRATE (PF) 100 MCG/2ML IJ SOLN
INTRAMUSCULAR | Status: AC
  Filled 2017-12-18: qty 2

## 2017-12-18 MED ORDER — SUGAMMADEX SODIUM 200 MG/2ML IV SOLN
INTRAVENOUS | Status: DC | PRN
  Administered 2017-12-18: 139.8 mg via INTRAVENOUS

## 2017-12-18 MED ORDER — LIDOCAINE HCL (PF) 2 % IJ SOLN
INTRAMUSCULAR | Status: AC
  Filled 2017-12-18: qty 10

## 2017-12-18 MED ORDER — SODIUM CHLORIDE 0.9 % IV SOLN
INTRAVENOUS | Status: DC
  Administered 2017-12-18 (×3): via INTRAVENOUS

## 2017-12-18 MED ORDER — LIDOCAINE HCL (CARDIAC) PF 100 MG/5ML IV SOSY
PREFILLED_SYRINGE | INTRAVENOUS | Status: DC | PRN
  Administered 2017-12-18: 60 mg via INTRAVENOUS

## 2017-12-18 MED ORDER — FENTANYL CITRATE (PF) 100 MCG/2ML IJ SOLN
25.0000 ug | INTRAMUSCULAR | Status: DC | PRN
  Administered 2017-12-18 (×4): 25 ug via INTRAVENOUS

## 2017-12-18 MED ORDER — IOTHALAMATE MEGLUMINE 43 % IV SOLN
INTRAVENOUS | Status: DC | PRN
  Administered 2017-12-18: 15 mL via URETHRAL

## 2017-12-18 MED ORDER — MIDAZOLAM HCL 2 MG/2ML IJ SOLN
INTRAMUSCULAR | Status: AC
  Filled 2017-12-18: qty 2

## 2017-12-18 MED ORDER — FENTANYL CITRATE (PF) 100 MCG/2ML IJ SOLN
INTRAMUSCULAR | Status: AC
  Administered 2017-12-18: 25 ug via INTRAVENOUS
  Filled 2017-12-18: qty 2

## 2017-12-18 MED ORDER — FAMOTIDINE 20 MG PO TABS
ORAL_TABLET | ORAL | Status: AC
  Administered 2017-12-18: 20 mg via ORAL
  Filled 2017-12-18: qty 1

## 2017-12-18 MED ORDER — HYDROCODONE-ACETAMINOPHEN 5-325 MG PO TABS: 1.0000 | ORAL_TABLET | Freq: Four times a day (QID) | ORAL | 0 refills | Status: DC | PRN

## 2017-12-18 SURGICAL SUPPLY — 31 items
ADHESIVE MASTISOL STRL (MISCELLANEOUS) ×2 IMPLANT
BAG DRAIN CYSTO-URO LG1000N (MISCELLANEOUS) ×2 IMPLANT
BASKET PARACHUTE 3.1  ZE (MISCELLANEOUS) ×2 IMPLANT
BASKET ZERO TIP 1.9FR (BASKET) ×4 IMPLANT
BRUSH SCRUB EZ 1% IODOPHOR (MISCELLANEOUS) ×2 IMPLANT
CATH URETL 5X70 OPEN END (CATHETERS) ×2 IMPLANT
CNTNR SPEC 2.5X3XGRAD LEK (MISCELLANEOUS)
CONRAY 43 FOR UROLOGY 50M (MISCELLANEOUS) ×2 IMPLANT
CONT SPEC 4OZ STER OR WHT (MISCELLANEOUS)
CONTAINER SPEC 2.5X3XGRAD LEK (MISCELLANEOUS) IMPLANT
DRAPE UTILITY 15X26 TOWEL STRL (DRAPES) ×2 IMPLANT
DRSG TEGADERM 2-3/8X2-3/4 SM (GAUZE/BANDAGES/DRESSINGS) ×2 IMPLANT
FIBER LASER LITHO 273 (Laser) ×2 IMPLANT
GLOVE BIO SURGEON STRL SZ 6.5 (GLOVE) ×2 IMPLANT
GOWN STRL REUS W/ TWL LRG LVL3 (GOWN DISPOSABLE) ×2 IMPLANT
GOWN STRL REUS W/TWL LRG LVL3 (GOWN DISPOSABLE) ×2
GUIDEWIRE GREEN .038 145CM (MISCELLANEOUS) ×2 IMPLANT
INFUSOR MANOMETER BAG 3000ML (MISCELLANEOUS) ×4 IMPLANT
INTRODUCER DILATOR DOUBLE (INTRODUCER) ×2 IMPLANT
KIT TURNOVER CYSTO (KITS) ×2 IMPLANT
PACK CYSTO AR (MISCELLANEOUS) ×2 IMPLANT
SENSORWIRE 0.038 NOT ANGLED (WIRE) ×4
SET CYSTO W/LG BORE CLAMP LF (SET/KITS/TRAYS/PACK) ×2 IMPLANT
SHEATH URETERAL 12FRX35CM (MISCELLANEOUS) ×2 IMPLANT
SOL .9 NS 3000ML IRR  AL (IV SOLUTION) ×2
SOL .9 NS 3000ML IRR UROMATIC (IV SOLUTION) ×2 IMPLANT
STENT URET 6FRX24 CONTOUR (STENTS) ×2 IMPLANT
STENT URET 6FRX26 CONTOUR (STENTS) IMPLANT
SURGILUBE 2OZ TUBE FLIPTOP (MISCELLANEOUS) ×2 IMPLANT
WATER STERILE IRR 1000ML POUR (IV SOLUTION) ×2 IMPLANT
WIRE SENSOR 0.038 NOT ANGLED (WIRE) ×2 IMPLANT

## 2017-12-18 NOTE — Transfer of Care (Signed)
Immediate Anesthesia Transfer of Care Note  Patient: Jasmine Henderson  Procedure(s) Performed: CYSTOSCOPY/URETEROSCOPY/HOLMIUM LASER/STENT Exchange (Right Ureter)  Patient Location: PACU  Anesthesia Type:General  Level of Consciousness: sedated  Airway & Oxygen Therapy: Patient Spontanous Breathing and Patient connected to face mask oxygen  Post-op Assessment: Report given to RN and Post -op Vital signs reviewed and stable  Post vital signs: Reviewed and stable  Last Vitals:  Vitals Value Taken Time  BP 110/70 12/18/2017  3:48 PM  Temp 36.3 C 12/18/2017  3:48 PM  Pulse 96 12/18/2017  3:52 PM  Resp 15 12/18/2017  3:52 PM  SpO2 100 % 12/18/2017  3:52 PM  Vitals shown include unvalidated device data.  Last Pain:  Vitals:   12/18/17 1548  PainSc: Asleep         Complications: No apparent anesthesia complications

## 2017-12-18 NOTE — Anesthesia Post-op Follow-up Note (Signed)
Anesthesia QCDR form completed.        

## 2017-12-18 NOTE — Anesthesia Procedure Notes (Signed)
Procedure Name: Intubation Date/Time: 12/18/2017 12:23 PM Performed by: Dionne Bucy, CRNA Pre-anesthesia Checklist: Patient identified, Patient being monitored, Timeout performed, Emergency Drugs available and Suction available Patient Re-evaluated:Patient Re-evaluated prior to induction Oxygen Delivery Method: Circle system utilized Preoxygenation: Pre-oxygenation with 100% oxygen Induction Type: IV induction Ventilation: Mask ventilation without difficulty Laryngoscope Size: Mac and 3 Grade View: Grade I Tube type: Oral Tube size: 7.0 mm Number of attempts: 1 Airway Equipment and Method: Stylet Placement Confirmation: ETT inserted through vocal cords under direct vision,  positive ETCO2 and breath sounds checked- equal and bilateral Secured at: 21 cm Tube secured with: Tape Dental Injury: Teeth and Oropharynx as per pre-operative assessment

## 2017-12-18 NOTE — Discharge Instructions (Addendum)
You have a ureteral stent in place.  This is a tube that extends from your kidney to your bladder.  This may cause urinary bleeding, burning with urination, and urinary frequency.  Please call our office or present to the ED if you develop fevers >101 or pain which is not able to be controlled with oral pain medications.  You may be given either Flomax and/ or ditropan to help with bladder spasms and stent pain in addition to pain medications.    [facility] [address], [address] [place], [place] [address] [phone]    AMBULATORY SURGERY  DISCHARGE INSTRUCTIONS   1) The drugs that you were given will stay in your system until tomorrow so for the next 24 hours you should not:  A) Drive an automobile B) Make any legal decisions C) Drink any alcoholic beverage   2) You may resume regular meals tomorrow.  Today it is better to start with liquids and gradually work up to solid foods.  You may eat anything you prefer, but it is better to start with liquids, then soup and crackers, and gradually work up to solid foods.   3) Please notify your doctor immediately if you have any unusual bleeding, trouble breathing, redness and pain at the surgery site, drainage, fever, or pain not relieved by medication.    4) Additional Instructions:        Please contact your physician with any problems or Same Day Surgery at [phone], Monday through Friday 6 am to 4 pm, or [place] [facility] Main number at [phone].   CIRUGIA AMBULATORIA       Instruccionnes de alta       1.  Las drogas que se Dispensing opticianle administraron permaneceran en su cuerpo [employer]asta [place], asi            que por las proximas 24 horas usted no debe:   Conducir Field seismologist(manejar) un automovil   Hacer ninguna decision legal   Tomar ninguna bebida alcoholica  2.  A) [place] puede comenzar una dieta regular.  Es mejor que hoy empiece con           liquidos y gradualmente anada [address].       B) Puede comer cualquier comida que desee pero es mejor empezar con liquidos,                      luego sopitas con galletas saladas y gradualmente llegar a las comidas solidas.  3.  Por favor avise a su medico inmediatamente si usted tiene algun sangrado anormal,       tiene dificultad con la respiracion, enrojecimiento y Engineer, miningdolor en el sitio de la cirugia, Saxondrenaje,       fiebro o dolor que se alivia con [facility]icina.  4.  A) Su visita posoperatoria (despues de su operacion) es con el  As scheduled  5.  Istrucciones especificas :

## 2017-12-18 NOTE — Anesthesia Preprocedure Evaluation (Addendum)
Anesthesia Evaluation  Patient identified by MRN, date of birth, ID band Patient awake    Reviewed: Allergy & Precautions, NPO status , Patient's Chart, lab work & pertinent test results, reviewed documented beta blocker date and time   Airway Mallampati: II  TM Distance: >3 FB     Dental  (+) Chipped   Pulmonary           Cardiovascular      Neuro/Psych Seizures -,     GI/Hepatic   Endo/Other  Hypothyroidism   Renal/GU Renal disease     Musculoskeletal   Abdominal   Peds  Hematology   Anesthesia Other Findings   Reproductive/Obstetrics                             Anesthesia Physical Anesthesia Plan  ASA: II  Anesthesia Plan: General   Post-op Pain Management:    Induction: Intravenous  PONV Risk Score and Plan:   Airway Management Planned: LMA and Oral ETT  Additional Equipment:   Intra-op Plan:   Post-operative Plan:   Informed Consent: I have reviewed the patients History and Physical, chart, labs and discussed the procedure including the risks, benefits and alternatives for the proposed anesthesia with the patient or authorized representative who has indicated his/her understanding and acceptance.     Plan Discussed with: CRNA  Anesthesia Plan Comments:        Anesthesia Quick Evaluation

## 2017-12-18 NOTE — Interval H&P Note (Signed)
History and Physical Interval Note:  12/18/2017 12:00 PM  Jasmine Henderson  has presented today for surgery, with the diagnosis of right nephrolithiasis  The various methods of treatment have been discussed with the patient and family. After consideration of risks, benefits and other options for treatment, the patient has consented to  Procedure(s): CYSTOSCOPY/URETEROSCOPY/HOLMIUM LASER/STENT Exchange (Right) as a surgical intervention .  The patient's history has been reviewed, patient examined, no change in status, stable for surgery.  I have reviewed the patient's chart and labs.  Questions were answered to the patient's satisfaction.    RRR CTAB  Vanna ScotlandAshley Epiphany Seltzer

## 2017-12-18 NOTE — Anesthesia Postprocedure Evaluation (Signed)
Anesthesia Post Note  Patient: Jasmine Henderson  Procedure(s) Performed: CYSTOSCOPY/URETEROSCOPY/HOLMIUM LASER/STENT Exchange (Right Ureter)  Patient location during evaluation: PACU Anesthesia Type: General Level of consciousness: awake and alert Pain management: pain level controlled Vital Signs Assessment: post-procedure vital signs reviewed and stable Respiratory status: spontaneous breathing, nonlabored ventilation, respiratory function stable and patient connected to nasal cannula oxygen Cardiovascular status: blood pressure returned to baseline and stable Postop Assessment: no apparent nausea or vomiting Anesthetic complications: no     Last Vitals:  Vitals:   12/18/17 1633 12/18/17 1644  BP: 130/75 133/85  Pulse: 80 91  Resp: 12 14  Temp:  (!) 36.4 C  SpO2: 99% 100%    Last Pain:  Vitals:   12/18/17 1644  TempSrc: Temporal  PainSc: 0-No pain                 Makyla Bye S

## 2017-12-18 NOTE — Op Note (Signed)
Date of procedure: 12/18/17  Preoperative diagnosis:  1. History of full right staghorn calculus 2. Retained right kidney stone following PCNL  Postoperative diagnosis:  1. Same as above  Procedure: 1. Right ureteroscopy 2. Laser lithotripsy 3. Right ureteral stent exchange 4. Basket extraction of stone fragment 5. Right ureteral stent exchange  Surgeon: Vanna ScotlandAshley Kalan Yeley, MD  Anesthesia: General  Complications: None  Intraoperative findings: Large at least 1.5 cm upper midpole stone occupying an entire calyx, along with a few additional smaller stones.  Multiple ureteral stones also treated.  EBL: Minimal  Specimens: None  Drains: 6 x 24 French double-J ureteral stent on right  Indication: Jasmine JennyBrenda Henderson is a 42 y.o. patient with personal history of complete right staghorn calculus status post right PCNL with retained stone in the right upper midpole calyx.  She returns today to the office for ureteroscopic management of her residual stone burden..  After reviewing the management options for treatment, she elected to proceed with the above surgical procedure(s). We have discussed the potential benefits and risks of the procedure, side effects of the proposed treatment, the likelihood of the patient achieving the goals of the procedure, and any potential problems that might occur during the procedure or recuperation. Informed consent has been obtained.  Description of procedure:  The patient was taken to the operating room and general anesthesia was induced.  The patient was placed in the dorsal lithotomy position, prepped and draped in the usual sterile fashion, and preoperative antibiotics were administered. A preoperative time-out was performed.   A 21 French cystoscope was advanced per urethra into the bladder.  Attention was turned to the right ureteral orifice from which a ureteral stent was seen emanating.  The distal coil of the stent was grasped and brought to level  the urethral meatus.  The stent was then cannulated using a sensor wire up to level of the kidneys.  The stent was removed leaving the wire in place.  On scout imaging, a large amount of stone in the mid upper pole calyx was easily identifiable.  A dual-lumen introducer was used to introduce a second Super Stiff wire up to level of the kidney.  The sensor wire was snapped in place as a safety wire.  This point time, a 05/1434 cm Cook ureteral access sheath was advanced over the Super Stiff wire up to level of the proximal ureter.  The inner lumen was removed.  An 8 French dual-lumen Wolf flexible digital ureteroscope was then advanced up to level of the kidney.  The renal pelvis, there was some amorphous material, possibly representing an old clot which was evacuated using a 1.9 French tipless nitinol basket.  The remainder of the calyces were scoped and a small amount of stone debris was present, otherwise the kidney had healed well.  The largest at least 1.5 cm stone was encountered in the upper midpole calyx.  A 273 m laser fiber was then brought in and using multiple settings, primarily 0.2 J and 40 Hz, the stone was fragmented over the course of several hours and 2 tiny pieces.  The larger pieces were extracted using a 1.9 French tipless I no basket.  I alternated techniques basketing, dusting, and pulverized in the stone until ultimately it was able to clear the majority of the significant stone burden.  All residual stone remaining within the kidney was minuscule, approximately the tip of the size of the laser fiber smaller.  From retrograde pyelogram to ensure that there was no  residual filling defects.  On scout imaging, the stone could no longer be seen prior to retrograde as well.  There is no extravasation of contrast and no filling defects.  Each every calyx was then directly visualized and no significant stone burden was identified.  Upon slowly backing the access sheath out of the ureter, did  encounter several good-sized stones, some requiring fragmentation with the laser, using settings of 0.8 J and 10 Hz and basketing.  Within the distal ureter, there was some stones which appeared to be partially embedded into the wall of the ureter.  I was able to dislodge all of the stones and fragmented and extracted using 1.9 [place] tipless nitinol basket.  A final retrograde pyelogram was performed which showed no residual filling defects, excellent opacification of the ureter, no extravasation.  Due to the appearance of the ureter as well as the amount of stone dust material, I elected to replace the stent.  A 6 x 24 French double-J ureteral stent was advanced over the wire up to level of the kidney.  The wires partially drawn until focal stone within the renal pelvis.  The wire was then fully withdrawn a focal is noted within the bladder.  The bladder was then drained.  The patient was then clean and dry, repositioned in supine position, reversed from anesthesia, and taken to the PACU in stable condition.  Plan: Findings were discussed with the patient's family.  She will return to the office in about 10 to 14 days for cystoscopy stent removal to allow for interval passage of stone burden.    Vanna Scotland, M.D.

## 2017-12-19 ENCOUNTER — Encounter: Payer: Self-pay | Admitting: Urology

## 2017-12-23 ENCOUNTER — Ambulatory Visit: Payer: Self-pay

## 2017-12-23 ENCOUNTER — Emergency Department: Payer: Self-pay

## 2017-12-23 ENCOUNTER — Inpatient Hospital Stay
Admission: EM | Admit: 2017-12-23 | Discharge: 2017-12-26 | DRG: 862 | Disposition: A | Discharge status: Home / Self Care | Payer: Self-pay | Source: Ambulatory Visit | Attending: Internal Medicine | Admitting: Internal Medicine

## 2017-12-23 ENCOUNTER — Other Ambulatory Visit: Payer: Self-pay

## 2017-12-23 ENCOUNTER — Encounter: Payer: Self-pay | Admitting: Emergency Medicine

## 2017-12-23 DIAGNOSIS — E039 Hypothyroidism, unspecified: Secondary | ICD-10-CM | POA: Diagnosis present

## 2017-12-23 DIAGNOSIS — A419 Sepsis, unspecified organism: Secondary | ICD-10-CM

## 2017-12-23 DIAGNOSIS — K5903 Drug induced constipation: Secondary | ICD-10-CM | POA: Diagnosis present

## 2017-12-23 DIAGNOSIS — T8140XA Infection following a procedure, unspecified, initial encounter: Principal | ICD-10-CM | POA: Diagnosis present

## 2017-12-23 DIAGNOSIS — N10 Acute tubulo-interstitial nephritis: Secondary | ICD-10-CM | POA: Diagnosis present

## 2017-12-23 DIAGNOSIS — N12 Tubulo-interstitial nephritis, not specified as acute or chronic: Secondary | ICD-10-CM

## 2017-12-23 DIAGNOSIS — N139 Obstructive and reflux uropathy, unspecified: Secondary | ICD-10-CM | POA: Diagnosis present

## 2017-12-23 DIAGNOSIS — Y92009 Unspecified place in unspecified non-institutional (private) residence as the place of occurrence of the external cause: Secondary | ICD-10-CM

## 2017-12-23 DIAGNOSIS — A4159 Other Gram-negative sepsis: Secondary | ICD-10-CM | POA: Diagnosis present

## 2017-12-23 DIAGNOSIS — Z87442 Personal history of urinary calculi: Secondary | ICD-10-CM

## 2017-12-23 DIAGNOSIS — N200 Calculus of kidney: Secondary | ICD-10-CM

## 2017-12-23 DIAGNOSIS — T50905A Adverse effect of unspecified drugs, medicaments and biological substances, initial encounter: Secondary | ICD-10-CM | POA: Diagnosis present

## 2017-12-23 HISTORY — DX: Sepsis, unspecified organism: A41.9

## 2017-12-23 LAB — MICROSCOPIC EXAMINATION
RBC, UA: >30 /hpf — ABNORMAL HIGH (ref 0–2)
WBC, UA: >30 /hpf — ABNORMAL HIGH (ref 0–5)

## 2017-12-23 LAB — URINALYSIS, COMPLETE (UACMP) WITH MICROSCOPIC
BILIRUBIN URINE: NEGATIVE
GLUCOSE, UA: NEGATIVE mg/dL
KETONES UR: NEGATIVE mg/dL
Nitrite: NEGATIVE
PH: 9 — AB (ref 5.0–8.0)
PROTEIN: 30 mg/dL — AB
Specific Gravity, Urine: 1.006 (ref 1.005–1.030)

## 2017-12-23 LAB — CBC WITH DIFFERENTIAL/PLATELET
BASOS ABS: 0 10*3/uL (ref 0–0.1)
Basophils Relative: 0 %
EOS ABS: 0 10*3/uL (ref 0–0.7)
EOS PCT: 0 %
HCT: 37.2 % (ref 35.0–47.0)
Hemoglobin: 12.8 g/dL (ref 12.0–16.0)
Lymphocytes Relative: 4 %
Lymphs Abs: 0.6 10*3/uL — ABNORMAL LOW (ref 1.0–3.6)
MCH: 30.2 pg (ref 26.0–34.0)
MCHC: 34.4 g/dL (ref 32.0–36.0)
MCV: 87.9 fL (ref 80.0–100.0)
Monocytes Absolute: 0.8 10*3/uL (ref 0.2–0.9)
Monocytes Relative: 5 %
Neutro Abs: 14.7 10*3/uL — ABNORMAL HIGH (ref 1.4–6.5)
Neutrophils Relative %: 91 %
PLATELETS: 249 10*3/uL (ref 150–440)
RBC: 4.23 MIL/uL (ref 3.80–5.20)
RDW: 13.5 % (ref 11.5–14.5)
WBC: 16 10*3/uL — AB (ref 3.6–11.0)

## 2017-12-23 LAB — COMPREHENSIVE METABOLIC PANEL
ALK PHOS: 83 U/L (ref 38–126)
ALT: 17 U/L (ref 0–44)
AST: 34 U/L (ref 15–41)
Albumin: 3.8 g/dL (ref 3.5–5.0)
Anion gap: 11 (ref 5–15)
BUN: 8 mg/dL (ref 6–20)
CALCIUM: 8.7 mg/dL — AB (ref 8.9–10.3)
CO2: 20 mmol/L — ABNORMAL LOW (ref 22–32)
CREATININE: 0.86 mg/dL (ref 0.44–1.00)
Chloride: 101 mmol/L (ref 98–111)
GFR calc Af Amer: >60 mL/min (ref >60)
Glucose, Bld: 183 mg/dL — ABNORMAL HIGH (ref 70–99)
Potassium: 3.5 mmol/L (ref 3.5–5.1)
Sodium: 132 mmol/L — ABNORMAL LOW (ref 135–145)
TOTAL PROTEIN: 8 g/dL (ref 6.5–8.1)
Total Bilirubin: 0.6 mg/dL (ref 0.3–1.2)

## 2017-12-23 LAB — URINALYSIS, COMPLETE
BILIRUBIN UA: NEGATIVE
Glucose, UA: NEGATIVE
KETONES UA: NEGATIVE
Nitrite, UA: NEGATIVE
SPEC GRAV UA: 1.015 (ref 1.005–1.030)
Urobilinogen, Ur: 0.2 mg/dL (ref 0.2–1.0)
pH, UA: >9 — ABNORMAL HIGH (ref 5.0–7.5)

## 2017-12-23 LAB — LACTIC ACID, PLASMA
LACTIC ACID, VENOUS: 2.8 mmol/L — AB (ref 0.5–1.9)
Lactic Acid, Venous: 1.6 mmol/L (ref 0.5–1.9)

## 2017-12-23 MED ORDER — SENNOSIDES-DOCUSATE SODIUM 8.6-50 MG PO TABS
1.0000 | ORAL_TABLET | Freq: Two times a day (BID) | ORAL | Status: DC
  Administered 2017-12-23 – 2017-12-26 (×7): 1 via ORAL
  Filled 2017-12-23 (×7): qty 1

## 2017-12-23 MED ORDER — MORPHINE SULFATE (PF) 4 MG/ML IV SOLN
4.0000 mg | Freq: Once | INTRAVENOUS | Status: AC
  Administered 2017-12-23: 4 mg via INTRAVENOUS

## 2017-12-23 MED ORDER — ONDANSETRON HCL 4 MG/2ML IJ SOLN
4.0000 mg | Freq: Four times a day (QID) | INTRAMUSCULAR | Status: DC | PRN
  Administered 2017-12-24: 4 mg via INTRAVENOUS
  Filled 2017-12-23: qty 2

## 2017-12-23 MED ORDER — SODIUM CHLORIDE 0.9 % IV SOLN
INTRAVENOUS | Status: DC
  Administered 2017-12-23 – 2017-12-25 (×3): via INTRAVENOUS

## 2017-12-23 MED ORDER — ONDANSETRON HCL 4 MG/2ML IJ SOLN
INTRAMUSCULAR | Status: AC
  Filled 2017-12-23: qty 2

## 2017-12-23 MED ORDER — HEPARIN SODIUM (PORCINE) 5000 UNIT/ML IJ SOLN
5000.0000 [IU] | Freq: Three times a day (TID) | INTRAMUSCULAR | Status: DC
  Administered 2017-12-23 – 2017-12-25 (×7): 5000 [IU] via SUBCUTANEOUS
  Filled 2017-12-23 (×8): qty 1

## 2017-12-23 MED ORDER — POLYETHYLENE GLYCOL 3350 17 G PO PACK
17.0000 g | PACK | Freq: Every day | ORAL | Status: DC
  Administered 2017-12-23 – 2017-12-26 (×4): 17 g via ORAL
  Filled 2017-12-23 (×4): qty 1

## 2017-12-23 MED ORDER — SODIUM CHLORIDE 0.9 % IV SOLN
1000.0000 mL | Freq: Once | INTRAVENOUS | Status: AC
  Administered 2017-12-23: 1000 mL via INTRAVENOUS

## 2017-12-23 MED ORDER — MORPHINE SULFATE (PF) 4 MG/ML IV SOLN
INTRAVENOUS | Status: AC
  Filled 2017-12-23: qty 1

## 2017-12-23 MED ORDER — ACETAMINOPHEN 325 MG PO TABS
650.0000 mg | ORAL_TABLET | Freq: Four times a day (QID) | ORAL | Status: DC | PRN
  Administered 2017-12-23 – 2017-12-24 (×3): 650 mg via ORAL
  Filled 2017-12-23 (×3): qty 2

## 2017-12-23 MED ORDER — SODIUM CHLORIDE 0.9 % IV SOLN
2.0000 g | Freq: Once | INTRAVENOUS | Status: AC
  Administered 2017-12-23: 2 g via INTRAVENOUS
  Filled 2017-12-23: qty 20

## 2017-12-23 MED ORDER — HYDROCODONE-ACETAMINOPHEN 5-325 MG PO TABS
1.0000 | ORAL_TABLET | Freq: Four times a day (QID) | ORAL | Status: DC | PRN
  Administered 2017-12-23: 21:00:00 1 via ORAL
  Administered 2017-12-24 – 2017-12-25 (×3): 2 via ORAL
  Filled 2017-12-23: qty 1
  Filled 2017-12-23 (×3): qty 2

## 2017-12-23 MED ORDER — MORPHINE SULFATE (PF) 2 MG/ML IV SOLN
2.0000 mg | INTRAVENOUS | Status: DC | PRN
  Administered 2017-12-24 (×2): 2 mg via INTRAVENOUS
  Filled 2017-12-23 (×2): qty 1

## 2017-12-23 MED ORDER — SODIUM CHLORIDE 0.9 % IV SOLN
1.0000 g | INTRAVENOUS | Status: DC
  Administered 2017-12-24 – 2017-12-25 (×2): 1 g via INTRAVENOUS
  Filled 2017-12-23 (×2): qty 1

## 2017-12-23 MED ORDER — LEVOTHYROXINE SODIUM 100 MCG PO TABS
100.0000 ug | ORAL_TABLET | Freq: Every day | ORAL | Status: DC
  Administered 2017-12-24 – 2017-12-26 (×3): 100 ug via ORAL
  Filled 2017-12-23 (×3): qty 1

## 2017-12-23 MED ORDER — DOCUSATE SODIUM 100 MG PO CAPS: 100.0000 mg | ORAL_CAPSULE | Freq: Two times a day (BID) | ORAL | Status: DC | PRN

## 2017-12-23 MED ORDER — DOCUSATE SODIUM 100 MG PO CAPS
100.0000 mg | ORAL_CAPSULE | Freq: Two times a day (BID) | ORAL | Status: DC
  Administered 2017-12-23 – 2017-12-26 (×7): 100 mg via ORAL
  Filled 2017-12-23 (×7): qty 1

## 2017-12-23 MED ORDER — ONDANSETRON HCL 4 MG/2ML IJ SOLN
4.0000 mg | Freq: Once | INTRAMUSCULAR | Status: AC
  Administered 2017-12-23: 4 mg via INTRAVENOUS

## 2017-12-23 MED ORDER — TAMSULOSIN HCL 0.4 MG PO CAPS
0.4000 mg | ORAL_CAPSULE | Freq: Every day | ORAL | Status: DC
  Administered 2017-12-24 – 2017-12-26 (×3): 0.4 mg via ORAL
  Filled 2017-12-23 (×3): qty 1

## 2017-12-23 MED ORDER — SODIUM CHLORIDE 0.9 % IV BOLUS
2000.0000 mL | Freq: Once | INTRAVENOUS | Status: AC
  Administered 2017-12-23: 2000 mL via INTRAVENOUS

## 2017-12-23 NOTE — Progress Notes (Signed)
CODE SEPSIS - PHARMACY COMMUNICATION  **Broad Spectrum Antibiotics should be administered within 1 hour of Sepsis diagnosis**  Time Code Sepsis Called/Page Received: 0917   Antibiotics Ordered: ceftriaxone   Time of 1st antibiotic administration: 0958  Additional action taken by pharmacy: none   If necessary, Name of Provider/Nurse Contacted: N/A    Simpson,Michael L ,PharmD Clinical Pharmacist  12/23/2017  10:16 AM

## 2017-12-23 NOTE — Progress Notes (Signed)
Patient ID: Jasmine JennyBrenda Henderson, female   DOB: 03/23/1976, 42 y.o.   MRN: [id number]   I was called by Dr. Elisabeth PigeonVachhani for a possible consult on [name] who was admitted with probable sepsis of a urinary origin.   She has had a recent right PCNL on 6/10 and has a right ureteral stent in place.    I recommended placement of a foley catheter to maximize drainage and reviewed the CT which shows the stent in good position with no hydronephrosis.   There are some small stones along the PCNL tract but no renal abscess or perinephric fluid.  I don't believe any urologic intervention is needed at this time other than the foley.  I will notify Dr. Lonna CobbStoioff so she can be placed on the rounds list.

## 2017-12-23 NOTE — ED Triage Notes (Signed)
Patient sent from neurology office with spanish interpreter.  Patient presents to the ED with constipation, dizziness, and difficulty with urination.  Patient reports she had several episodes of urinary incontinence yesterday and is having difficulty urinating today.  Patient has a stent in her right kidney.  Patient reports intermittent fever.  Patient reports very small bowel movement today.  Patient states, "it was hard to push out."

## 2017-12-23 NOTE — ED Notes (Signed)
Return from CT scan.

## 2017-12-23 NOTE — H&P (Signed)
[employer] [facility] [place] [facility]   PATIENT NAME: Jasmine Henderson    MR#:  [id number]  DATE OF BIRTH:  02/05/1976  DATE OF ADMISSION:  12/23/2017  PRIMARY CARE PHYSICIAN: [facility]   REQUESTING/REFERRING PHYSICIAN: Cyril Loosenkinner  CHIEF COMPLAINT:   Chief Complaint  Patient presents with  . Constipation  . Abdominal Pain    HISTORY OF PRESENT ILLNESS: Jasmine Henderson  is a 42 y.o. female with a known history of Kidney stone, recent surgery and ureteral stent last week, pre-diabetic, seizures, thyroid disease- since surgery last week, still have urinary obstruction symptoms and now worsening right sided flank pain with fever and nausea for last 2-3 days , so came back to ER> Noted to be septic. CT abd shows patent ureteral stent. Given IV fluids, ABx and given for admission.  Pt was seen with help of video interpretor, husband in room.  PAST MEDICAL HISTORY:   Past Medical History:  Diagnosis Date  . History of kidney stones   . Hypothyroidism   . Pre-diabetes   . Seizures (HCC)    pt states she had dizzy spell, passed out and was seizing. unknown reason. NO treatment. neurologist could not find anything  . Thyroid disease     PAST SURGICAL HISTORY:  Past Surgical History:  Procedure Laterality Date  . CYSTOSCOPY/URETEROSCOPY/HOLMIUM LASER/STENT PLACEMENT Left 09/24/2017   Procedure: CYSTOSCOPY/URETEROSCOPY/left retrorade pyleogram;  Surgeon: Riki AltesStoioff, Scott C, MD;  Location: [facility] ORS;  Service: Urology;  Laterality: Left;  . CYSTOSCOPY/URETEROSCOPY/HOLMIUM LASER/STENT PLACEMENT Right 12/18/2017   Procedure: CYSTOSCOPY/URETEROSCOPY/HOLMIUM LASER/STENT Exchange;  Surgeon: Vanna ScotlandBrandon, Ashley, MD;  Location: [facility];  Service: Urology;  Laterality: Right;  . IR NEPHROSTOMY PLACEMENT RIGHT  11/25/2017  . NEPHROLITHOTOMY Right 11/25/2017   Procedure: NEPHROLITHOTOMY PERCUTANEOUS;  Surgeon: Vanna ScotlandBrandon, Ashley, MD;  Location: [facility];   Service: Urology;  Laterality: Right;    SOCIAL HISTORY:  Social History   Tobacco Use  . Smoking status: Never Smoker  . Smokeless tobacco: Never Used  Substance Use Topics  . Alcohol use: No    Frequency: Never    FAMILY HISTORY:  Family History  Problem Relation Age of Onset  . Bladder Cancer Neg Hx   . Kidney cancer Neg Hx     DRUG ALLERGIES: No Known Allergies  REVIEW OF SYSTEMS:   CONSTITUTIONAL: have fever, fatigue or weakness.  EYES: No blurred or double vision.  EARS, NOSE, AND THROAT: No tinnitus or ear pain.  RESPIRATORY: No cough, shortness of breath, wheezing or hemoptysis.  CARDIOVASCULAR: No chest pain, orthopnea, edema.  GASTROINTESTINAL: have nausea,no vomiting, diarrhea , right sided flank and lower abdominal pain.  GENITOURINARY: No dysuria, hematuria.  ENDOCRINE: No polyuria, nocturia,  HEMATOLOGY: No anemia, easy bruising or bleeding SKIN: No rash or lesion. MUSCULOSKELETAL: No joint pain or arthritis.   NEUROLOGIC: No tingling, numbness, weakness.  PSYCHIATRY: No anxiety or depression.   MEDICATIONS AT HOME:  Prior to Admission medications   Medication Sig Start Date End Date Taking? Authorizing Provider  docusate sodium (COLACE) 100 MG capsule Take 1 capsule (100 mg total) by mouth 2 (two) times daily. 11/26/17  Yes Vanna ScotlandBrandon, Ashley, MD  HYDROcodone-acetaminophen (NORCO/VICODIN) 5-325 MG tablet Take 1-2 tablets by mouth every 6 (six) hours as needed for moderate pain. 12/18/17  Yes Vanna ScotlandBrandon, Ashley, MD  levothyroxine (SYNTHROID, LEVOTHROID) 100 MCG tablet Take 100 mcg by mouth daily before breakfast.    Yes [provider]  tamsulosin (FLOMAX) 0.4 MG CAPS capsule Take 1  capsule (0.4 mg total) by mouth daily. 12/18/17  Yes Vanna Scotland, MD  oxybutynin (DITROPAN) 5 MG tablet Take 1 tablet (5 mg total) by mouth every 8 (eight) hours as needed for bladder spasms. Patient not taking: Reported on 12/12/2017 11/26/17   Vanna Scotland, MD       PHYSICAL EXAMINATION:   VITAL SIGNS: Blood pressure 138/77, pulse (!) 141, temperature (!) 103.2 F (39.6 C), temperature source Oral, resp. rate (!) 28, height 5\' 2"  (1.575 m), weight 69.4 kg (153 lb), last menstrual period 12/04/2017, SpO2 97 %.  GENERAL:  42 y.o.-year-old patient lying in the bed with no acute distress.  EYES: Pupils equal, round, reactive to light and accommodation. No scleral icterus. Extraocular muscles intact.  HEENT: Head atraumatic, normocephalic. Oropharynx and nasopharynx clear.  NECK:  Supple, no jugular venous distention. No thyroid enlargement, no tenderness.  LUNGS: Normal breath sounds bilaterally, no wheezing, rales,rhonchi or crepitation. No use of accessory muscles of respiration.  CARDIOVASCULAR: S1, S2 normal. No murmurs, rubs, or gallops.  ABDOMEN: Soft, right side tender, nondistended. Bowel sounds present. No organomegaly or mass.  EXTREMITIES: No pedal edema, cyanosis, or clubbing.  NEUROLOGIC: Cranial nerves II through XII are intact. Muscle strength 5/5 in all extremities. Sensation intact. Gait not checked.  PSYCHIATRIC: The patient is alert and oriented x 3.  SKIN: No obvious rash, lesion, or ulcer.   LABORATORY PANEL:   CBC Recent Labs  Lab 12/23/17 0929  WBC 16.0*  HGB 12.8  HCT 37.2  PLT 249  MCV 87.9  MCH 30.2  MCHC 34.4  RDW 13.5  LYMPHSABS 0.6*  MONOABS 0.8  EOSABS 0.0  BASOSABS 0.0   ------------------------------------------------------------------------------------------------------------------  Chemistries  Recent Labs  Lab 12/23/17 0929  NA 132*  K 3.5  CL 101  CO2 20*  GLUCOSE 183*  BUN 8  CREATININE 0.86  CALCIUM 8.7*  AST 34  ALT 17  ALKPHOS 83  BILITOT 0.6   ------------------------------------------------------------------------------------------------------------------ estimated creatinine clearance is 79.3 mL/min (by C-G formula based on SCr of 0.86  mg/dL). ------------------------------------------------------------------------------------------------------------------ No results for input(s): TSH, T4TOTAL, T3FREE, THYROIDAB in the last 72 hours.  Invalid input(s): FREET3   Coagulation profile No results for input(s): INR, PROTIME in the last 168 hours. ------------------------------------------------------------------------------------------------------------------- No results for input(s): DDIMER in the last 72 hours. -------------------------------------------------------------------------------------------------------------------  Cardiac Enzymes No results for input(s): CKMB, TROPONINI, MYOGLOBIN in the last 168 hours.  Invalid input(s): CK ------------------------------------------------------------------------------------------------------------------ Invalid input(s): POCBNP  ---------------------------------------------------------------------------------------------------------------  Urinalysis    Component Value Date/Time   COLORURINE YELLOW (A) 12/23/2017 0929   APPEARANCEUR HAZY (A) 12/23/2017 0929   APPEARANCEUR Cloudy (A) 12/23/2017 0845   LABSPEC 1.006 12/23/2017 0929   PHURINE 9.0 (H) 12/23/2017 0929   GLUCOSEU NEGATIVE 12/23/2017 0929   HGBUR LARGE (A) 12/23/2017 0929   BILIRUBINUR NEGATIVE 12/23/2017 0929   BILIRUBINUR Negative 12/23/2017 0845   KETONESUR NEGATIVE 12/23/2017 0929   PROTEINUR 30 (A) 12/23/2017 0929   NITRITE NEGATIVE 12/23/2017 0929   LEUKOCYTESUR LARGE (A) 12/23/2017 0929   LEUKOCYTESUR 3+ (A) 12/23/2017 0845     RADIOLOGY: Dg Abdomen 1 View  Result Date: 12/23/2017 CLINICAL DATA:  Constipation. EXAM: ABDOMEN - 1 VIEW COMPARISON:  None. FINDINGS: No abnormal bowel dilatation is noted. Large amount of stool seen throughout the colon. Right ureteral stent is noted in grossly good position. No abnormal calcifications are noted. IMPRESSION: No abnormal bowel dilatation is noted. Large  stool burden is noted. Right ureteral stent in grossly good position. Electronically Signed  By: Lupita Raider, M.D.   On: 12/23/2017 09:51   Dg Chest Port 1 View  Result Date: 12/23/2017 CLINICAL DATA:  Fever. EXAM: PORTABLE CHEST 1 VIEW COMPARISON:  None. FINDINGS: The heart size and mediastinal contours are within normal limits. Both lungs are clear. The visualized skeletal structures are unremarkable. IMPRESSION: No active disease. Electronically Signed   By: Lupita Raider, M.D.   On: 12/23/2017 09:50   Ct Renal Stone Study  Result Date: 12/23/2017 CLINICAL DATA:  Nephrolithotomy and stent placement 12/18/2017. EXAM: CT ABDOMEN AND PELVIS WITHOUT CONTRAST TECHNIQUE: Multidetector CT imaging of the abdomen and pelvis was performed following the standard protocol without IV contrast. COMPARISON:  CT abdomen pelvis 08/30/2017 FINDINGS: Lower chest: Negative Hepatobiliary: Limited imaging of the liver without focal lesion. Gallbladder and bile ducts normal Pancreas: Negative Spleen: Negative Adrenals/Urinary Tract: Right nephrostomy tube removed. Right ureteral stent in place in good position. The vast majority of the large staghorn calculus on the right has been removed. Small residual right lower pole renal calculi are present. Small calculi extend through the right lower pole nephrostomy defect. No obstruction of the right ureter. Foley catheter in the bladder which is decompressed. Moderate dilatation of the left renal pelvis and left ureter to the bladder. Question reimplantation of the distal left ureter. Small stone distal left ureter has passed since the prior CT Stomach/Bowel: Negative for bowel obstruction. No bowel mass or edema. Negative for pannus itis. Normal appearing appendix extending toward the inguinal canal unchanged. Vascular/Lymphatic: Negative Reproductive: Normal uterus.  No pelvic mass. Other: Negative for free fluid Musculoskeletal: Negative IMPRESSION: 1. Right ureteral stent  remains in place. Vast majority of the staghorn calculus has been removed with small residual calculi in the right lower pole and along the nephrostomy tract. No right ureteral calculus. Foley catheter in the bladder which is decompressed. 2. Moderate dilatation of the left renal collecting system without obstructing calculus. Electronically Signed   By: Marlan Palau M.D.   On: 12/23/2017 11:33    EKG: Orders placed or performed during the hospital encounter of 12/23/17  . EKG 12-Lead  . EKG 12-Lead    IMPRESSION AND PLAN:  * Sepsis   UTi   IV rocephin   Follow bl and ur cx   IV fluids  * Renal stone   Recent ureteral stent and stone removal was done by urology   CT abd shows no abnormalities   Spoke to Urology on phone, suggest- treat medically, follow cx.  * Hypothyroidism   Cont levothyroxine  * Constipation   Due to pain meds   Give miralax and senna-docusate   All the records are reviewed and case discussed with ED provider. Management plans discussed with the patient, family and they are in agreement.  CODE STATUS: Full.    Code Status Orders  (From admission, onward)        Start     Ordered   12/23/17 1328  Full code  Continuous     12/23/17 1327    Code Status History    Date Active Date Inactive Code Status Order ID Comments User Context   11/25/2017 1605 11/26/2017 2219 Full Code [id number]  Vanna Scotland, MD Inpatient       TOTAL TIME TAKING CARE OF THIS PATIENT: 45 minutes.    Altamese Dilling M.D on 12/23/2017   Between 7am to 6pm - Pager - [phone]  After 6pm go to [email or web address] - password [email or web address]  [employer]  Office  [phone]  CC: Primary care physician; [facility]   Note: This dictation was prepared with Nurse, children's dictation along with smaller phrase technology. Any transcriptional errors that result from this process are unintentional.

## 2017-12-23 NOTE — ED Provider Notes (Signed)
[facility] Emergency Department Provider Note   ____________________________________________    I have reviewed the triage vital signs and the nursing notes.   HISTORY  Chief Complaint Constipation and Abdominal Pain   Spanish interpreter used  HPI Jasmine Henderson is a 42 y.o. female who presents with complaints of right flank pain which is moderate to severe and sharp in nature.  Patient reports recent urological procedure, reviewed records and noted that patient has been to the OR twice initially for removal of staghorn calculus and most recently for removal of additional stones on the right, ureteral stent replaced at that time.  Patient sent over from neurology office given fever and difficulty urinating   Past Medical History:  Diagnosis Date  . History of kidney stones   . Hypothyroidism   . Pre-diabetes   . Seizures (HCC)    pt states she had dizzy spell, passed out and was seizing. unknown reason. NO treatment. neurologist could not find anything  . Thyroid disease     Patient Active Problem List   Diagnosis Date Noted  . 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 Hypothyroidism 09/03/2017  . 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 CYSTOSCOPY/URETEROSCOPY/HOLMIUM LASER/STENT PLACEMENT Left 09/24/2017   Procedure: CYSTOSCOPY/URETEROSCOPY/left retrorade pyleogram;  Surgeon: Riki AltesStoioff, Scott C, MD;  Location: [facility] ORS;  Service: Urology;  Laterality: Left;  . CYSTOSCOPY/URETEROSCOPY/HOLMIUM LASER/STENT PLACEMENT Right 12/18/2017   Procedure: CYSTOSCOPY/URETEROSCOPY/HOLMIUM LASER/STENT Exchange;  Surgeon: Vanna ScotlandBrandon, Ashley, MD;  Location: [facility] ORS;  Service: Urology;  Laterality: Right;  . IR NEPHROSTOMY PLACEMENT RIGHT  11/25/2017  . NEPHROLITHOTOMY Right 11/25/2017   Procedure: NEPHROLITHOTOMY PERCUTANEOUS;  Surgeon: Vanna ScotlandBrandon, Ashley, MD;   Location: [facility] ORS;  Service: Urology;  Laterality: Right;    Prior to Admission medications   Medication Sig Start Date End Date Taking? Authorizing Provider  docusate sodium (COLACE) 100 MG capsule Take 1 capsule (100 mg total) by mouth 2 (two) times daily. 11/26/17   Vanna ScotlandBrandon, Ashley, MD  HYDROcodone-acetaminophen (NORCO/VICODIN) 5-325 MG tablet Take 1-2 tablets by mouth every 6 (six) hours as needed for moderate pain. 12/18/17   Vanna ScotlandBrandon, Ashley, MD  levothyroxine (SYNTHROID, LEVOTHROID) 100 MCG tablet Take 100 mcg by mouth daily before breakfast.     [provider]  oxybutynin (DITROPAN) 5 MG tablet Take 1 tablet (5 mg total) by mouth every 8 (eight) hours as needed for bladder spasms. Patient not taking: Reported on 12/12/2017 11/26/17   Vanna ScotlandBrandon, Ashley, MD  tamsulosin (FLOMAX) 0.4 MG CAPS capsule Take 1 capsule (0.4 mg total) by mouth daily. 12/18/17   Vanna ScotlandBrandon, Ashley, MD     Allergies Patient has no known allergies.  Family History  Problem Relation Age of Onset  . Bladder Cancer Neg Hx   . Kidney cancer Neg Hx     Social History Social History   Tobacco Use  . Smoking status: Never Smoker  . Smokeless tobacco: Never Used  Substance Use Topics  . Alcohol use: No    Frequency: Never  . Drug use: No    Review of Systems  Constitutional: Positive fever Eyes: No visual changes.  ENT: No sore throat. Cardiovascular: Denies chest pain. Respiratory: Denies shortness of breath. Gastrointestinal: As above Genitourinary: Occulta urinating. Musculoskeletal: Negative for back pain. Skin: Negative for rash. Neurological: Negative for headaches    ____________________________________________   PHYSICAL EXAM:  VITAL SIGNS: ED Triage Vitals  Enc Vitals Group  BP 12/23/17 0911 119/79     Pulse Rate 12/23/17 0911 (!) 139     Resp 12/23/17 0911 20     Temp 12/23/17 0911 (!) 102.4 F (39.1 C)     Temp Source 12/23/17 0911 Oral     SpO2 12/23/17 0911 98 %      Weight 12/23/17 0910 69.4 kg (153 lb)     Height 12/23/17 0910 1.575 m (5\' 2" )     Head Circumference --      Peak Flow --      Pain Score 12/23/17 0910 8     Pain Loc --      Pain Edu? --      Excl. in GC? --     Constitutional: Alert and oriented.  Uncomfortable Eyes: Conjunctivae are normal.   Nose: No congestion/rhinnorhea. Mouth/Throat: Mucous membranes are moist.   Neck:  Painless ROM Cardiovascular: Normal rate, regular rhythm. Grossly normal heart sounds.  Good peripheral circulation. Respiratory: Normal respiratory effort.  No retractions. Lungs CTAB. Gastrointestinal: Soft and nontender. No distention.  Right CVA tenderness Genitourinary: deferred Musculoskeletal:.  Warm and well perfused Neurologic:  Normal speech and language. No gross focal neurologic deficits are appreciated.  Skin:  Skin is warm, dry and intact. No rash noted. Psychiatric: Mood and affect are normal. Speech and behavior are normal.  ____________________________________________   LABS (all labs ordered are listed, but only abnormal results are displayed)  Labs Reviewed  LACTIC ACID, PLASMA - Abnormal; Notable for the following components:      Result Value   Lactic Acid, Venous 2.8 (*)    All other components within normal limits  COMPREHENSIVE METABOLIC PANEL - Abnormal; Notable for the following components:   Sodium 132 (*)    CO2 20 (*)    Glucose, Bld 183 (*)    Calcium 8.7 (*)    All other components within normal limits  CBC WITH DIFFERENTIAL/PLATELET - Abnormal; Notable for the following components:   WBC 16.0 (*)    Neutro Abs 14.7 (*)    Lymphs Abs 0.6 (*)    All other components within normal limits  URINALYSIS, COMPLETE (UACMP) WITH MICROSCOPIC - Abnormal; Notable for the following components:   Color, Urine YELLOW (*)    APPearance HAZY (*)    pH 9.0 (*)    Hgb urine dipstick LARGE (*)    Protein, ur 30 (*)    Leukocytes, UA LARGE (*)    Bacteria, UA MANY (*)    All  other components within normal limits  CULTURE, BLOOD (ROUTINE X 2)  CULTURE, BLOOD (ROUTINE X 2)  URINE CULTURE  LACTIC ACID, PLASMA   ____________________________________________  EKG  ED ECG REPORT I, Jene Every, the attending physician, personally viewed and interpreted this ECG.  Date: 12/23/2017  Rhythm: Sinus tachycardia QRS Axis: normal Intervals: normal ST/T Wave abnormalities: normal Narrative Interpretation: no evidence of acute ischemia  ____________________________________________  RADIOLOGY  CT renal stone study does not show any obvious obstruction ____________________________________________   PROCEDURES  Procedure(s) performed: No  Procedures   Critical Care performed: yes  CRITICAL CARE Performed by: Jene Every   Total critical care time:72minutes  Critical care time was exclusive of separately billable procedures and treating other patients.  Critical care was necessary to treat or prevent imminent or life-threatening deterioration.  Critical care was time spent personally by me on the following activities: development of treatment plan with patient and/or surrogate as well as nursing, discussions with consultants, evaluation of  patient's response to treatment, examination of patient, obtaining history from patient or surrogate, ordering and performing treatments and interventions, ordering and review of laboratory studies, ordering and review of radiographic studies, pulse oximetry and re-evaluation of patient's condition.  ____________________________________________   INITIAL IMPRESSION / ASSESSMENT AND PLAN / ED COURSE  Pertinent labs & imaging results that were available during my care of the patient were reviewed by me and considered in my medical decision making (see chart for details).  Patient presents with fever and significant tachycardia in the setting of recent ureteral stent placement 5 days ago with difficulty  urinating.  Strong suspicion for pyelonephritis/sepsis, code sepsis called.  IV fluids infusing  Discussed with Dr. Wilson Singer of urology, he recommended Foley catheter and CT renal stone study to rule out obstruction  ----------------------------------------- 11:40 AM on 12/23/2017 -----------------------------------------  CT negative for obstruction, will discuss with medicine for admission ____________________________________________   FINAL CLINICAL IMPRESSION(S) / ED DIAGNOSES  Final diagnoses:  Sepsis, due to unspecified organism [facility])  Pyelonephritis        Note:  This document was prepared using Dragon voice recognition software and may include unintentional dictation errors.    Jene Every, MD 12/23/17 [phone]

## 2017-12-23 NOTE — Progress Notes (Signed)
Pt presents in clinic with severe pain 7 out of 10, pt states that she has has not had a bowel movement in several days despite taking colace BID. Pt states she has chills, fever, headache, dizziness, and nausea. Pt also c/o RT sided back pain and UTI sx. Pt advised to go to ED, physically wheeled pt to ED. Interpreter used for this visit.

## 2017-12-24 DIAGNOSIS — N12 Tubulo-interstitial nephritis, not specified as acute or chronic: Secondary | ICD-10-CM

## 2017-12-24 LAB — CBC
HCT: 31.7 % — ABNORMAL LOW (ref 35.0–47.0)
HEMOGLOBIN: 10.8 g/dL — AB (ref 12.0–16.0)
MCH: 30.3 pg (ref 26.0–34.0)
MCHC: 34.2 g/dL (ref 32.0–36.0)
MCV: 88.6 fL (ref 80.0–100.0)
PLATELETS: 181 10*3/uL (ref 150–440)
RBC: 3.57 MIL/uL — ABNORMAL LOW (ref 3.80–5.20)
RDW: 13.3 % (ref 11.5–14.5)
WBC: 13.3 10*3/uL — ABNORMAL HIGH (ref 3.6–11.0)

## 2017-12-24 LAB — BASIC METABOLIC PANEL
ANION GAP: 7 (ref 5–15)
BUN: 6 mg/dL (ref 6–20)
CALCIUM: 7.4 mg/dL — AB (ref 8.9–10.3)
CO2: 22 mmol/L (ref 22–32)
CREATININE: 0.62 mg/dL (ref 0.44–1.00)
Chloride: 107 mmol/L (ref 98–111)
Glucose, Bld: 150 mg/dL — ABNORMAL HIGH (ref 70–99)
Potassium: 2.7 mmol/L — CL (ref 3.5–5.1)
Sodium: 136 mmol/L (ref 135–145)

## 2017-12-24 LAB — HIV ANTIBODY (ROUTINE TESTING W REFLEX): HIV SCREEN 4TH GENERATION: NONREACTIVE

## 2017-12-24 MED ORDER — POTASSIUM CHLORIDE CRYS ER 20 MEQ PO TBCR
40.0000 meq | EXTENDED_RELEASE_TABLET | Freq: Two times a day (BID) | ORAL | Status: AC
  Administered 2017-12-24 – 2017-12-25 (×4): 40 meq via ORAL
  Filled 2017-12-24 (×5): qty 2

## 2017-12-24 NOTE — Progress Notes (Addendum)
CRITICAL VALUE STICKER  CRITICAL VALUE: Potassium 2.7  RECEIVER (on-site recipient of call):Dajuana Palen RN  DATE & TIME NOTIFIED: 12/24/17 0615  MESSENGER (representative from lab):  MD NOTIFIED: paged, pending  TIME OF NOTIFICATION: 12/24/17 0615  RESPONSE: po potassium ordered

## 2017-12-24 NOTE — Plan of Care (Signed)
  Problem: Education: Goal: Knowledge of General Education information will improve Outcome: Progressing   Problem: Health Behavior/Discharge Planning: Goal: Ability to manage health-related needs will improve Outcome: Progressing   Problem: Clinical Measurements: Goal: Ability to maintain clinical measurements within normal limits will improve Outcome: Progressing Goal: Will remain free from infection Outcome: Progressing Goal: Diagnostic test results will improve Outcome: Progressing Goal: Respiratory complications will improve Outcome: Progressing Goal: Cardiovascular complication will be avoided Outcome: Progressing   Problem: Elimination: Goal: Will not experience complications related to bowel motility Outcome: Progressing Goal: Will not experience complications related to urinary retention Outcome: Progressing   Problem: Safety: Goal: Ability to remain free from injury will improve Outcome: Progressing   Problem: Skin Integrity: Goal: Risk for impaired skin integrity will decrease Outcome: Progressing   

## 2017-12-24 NOTE — Progress Notes (Signed)
SWOT review of MEWS. Patient febrile overnight. Patient febrile this shift temp 101.5. PRN hydrocodone administered with 650 mg of tylenol prior to collection. Notified primary nurse.

## 2017-12-24 NOTE — Consult Note (Signed)
Urology Consult Follow Up  Subjective: Abdominal pain has improved.  Complaining of headache and constipation.  Preliminary urine growing gram-negative rods.  Anti-infectives: Anti-infectives (From admission, onward)   Start     Dose/Rate Route Frequency Ordered Stop   12/24/17 1000  cefTRIAXone (ROCEPHIN) 1 g in sodium chloride 0.9 % 100 mL IVPB     1 g 200 mL/hr over 30 Minutes Intravenous Every 24 hours 12/23/17 1207     12/23/17 1000  cefTRIAXone (ROCEPHIN) 2 g in sodium chloride 0.9 % 100 mL IVPB     2 g 200 mL/hr over 30 Minutes Intravenous  Once 12/23/17 0948 12/23/17 1034      Current Facility-Administered Medications  Medication Dose Route Frequency Provider Last Rate Last Dose  . 0.9 %  sodium chloride infusion   Intravenous Continuous Altamese DillingVachhani, Vaibhavkumar, MD 75 mL/hr at 12/24/17 1446    . acetaminophen (TYLENOL) tablet 650 mg  650 mg Oral Q6H PRN Altamese DillingVachhani, Vaibhavkumar, MD   650 mg at 12/23/17 1918  . cefTRIAXone (ROCEPHIN) 1 g in sodium chloride 0.9 % 100 mL IVPB  1 g Intravenous Q24H Altamese DillingVachhani, Vaibhavkumar, MD   Stopped at 12/24/17 1038  . docusate sodium (COLACE) capsule 100 mg  100 mg Oral BID Altamese DillingVachhani, Vaibhavkumar, MD   100 mg at 12/24/17 0735  . docusate sodium (COLACE) capsule 100 mg  100 mg Oral BID PRN Altamese DillingVachhani, Vaibhavkumar, MD      . heparin injection 5,000 Units  5,000 Units Subcutaneous Q8H Altamese DillingVachhani, Vaibhavkumar, MD   5,000 Units at 12/24/17 1444  . HYDROcodone-acetaminophen (NORCO/VICODIN) 5-325 MG per tablet 1-2 tablet  1-2 tablet Oral Q6H PRN Altamese DillingVachhani, Vaibhavkumar, MD   2 tablet at 12/24/17 0959  . levothyroxine (SYNTHROID, LEVOTHROID) tablet 100 mcg  100 mcg Oral QAC breakfast Altamese DillingVachhani, Vaibhavkumar, MD   100 mcg at 12/24/17 0735  . morphine 2 MG/ML injection 2 mg  2 mg Intravenous Q4H PRN Altamese DillingVachhani, Vaibhavkumar, MD   2 mg at 12/24/17 1105  . ondansetron (ZOFRAN) injection 4 mg  4 mg Intravenous Q6H PRN Altamese DillingVachhani, Vaibhavkumar, MD      . polyethylene  glycol (MIRALAX / GLYCOLAX) packet 17 g  17 g Oral Daily Altamese DillingVachhani, Vaibhavkumar, MD   17 g at 12/24/17 0734  . potassium chloride SA (K-DUR,KLOR-CON) CR tablet 40 mEq  40 mEq Oral BID Barbaraann RondoSridharan, Prasanna, MD   40 mEq at 12/24/17 0952  . senna-docusate (Senokot-S) tablet 1 tablet  1 tablet Oral BID Altamese DillingVachhani, Vaibhavkumar, MD   1 tablet at 12/24/17 0735  . tamsulosin (FLOMAX) capsule 0.4 mg  0.4 mg Oral Daily Altamese DillingVachhani, Vaibhavkumar, MD   0.4 mg at 12/24/17 [id number]     Objective: Vital signs in last 24 hours: Temp:  [97.6 F (36.4 C)-102.3 F (39.1 C)] 97.6 F (36.4 C) (07/09 1323) Pulse Rate:  [108-137] 120 (07/09 1323) Resp:  [17-18] 18 (07/09 1323) BP: (94-117)/(60-74) 117/74 (07/09 1323) SpO2:  [93 %-98 %] 93 % (07/09 1323)  Intake/Output from previous day: 07/08 0701 - 07/09 0700 In: 3717.5 [P.O.:240; I.V.:2077.5; IV Piggyback:100] Out: 1750 [Urine:1750] Intake/Output this shift: Total I/O In: 1208.8 [P.O.:240; I.V.:808.8; IV Piggyback:160] Out: 1100 [Urine:1100]   Physical Exam: Abdomen soft  Lab Results:  Recent Labs    12/23/17 0929 12/24/17 0431  WBC 16.0* 13.3*  HGB 12.8 10.8*  HCT 37.2 31.7*  PLT 249 181   BMET Recent Labs    12/23/17 0929 12/24/17 0431  NA 132* 136  K 3.5 2.7*  CL 101  107  CO2 20* 22  GLUCOSE 183* 150*  BUN 8 6  CREATININE 0.86 0.62  CALCIUM 8.7* 7.4*    Studies/Results: Dg Abdomen 1 View  Result Date: 12/23/2017 CLINICAL DATA:  Constipation. EXAM: ABDOMEN - 1 VIEW COMPARISON:  None. FINDINGS: No abnormal bowel dilatation is noted. Large amount of stool seen throughout the colon. Right ureteral stent is noted in grossly good position. No abnormal calcifications are noted. IMPRESSION: No abnormal bowel dilatation is noted. Large stool burden is noted. Right ureteral stent in grossly good position. Electronically Signed   By: Lupita Raider, M.D.   On: 12/23/2017 09:51   Dg Chest Port 1 View  Result Date: 12/23/2017 CLINICAL DATA:   Fever. EXAM: PORTABLE CHEST 1 VIEW COMPARISON:  None. FINDINGS: The heart size and mediastinal contours are within normal limits. Both lungs are clear. The visualized skeletal structures are unremarkable. IMPRESSION: No active disease. Electronically Signed   By: Lupita Raider, M.D.   On: 12/23/2017 09:50   Ct Renal Stone Study  Result Date: 12/23/2017 CLINICAL DATA:  Nephrolithotomy and stent placement 12/18/2017. EXAM: CT ABDOMEN AND PELVIS WITHOUT CONTRAST TECHNIQUE: Multidetector CT imaging of the abdomen and pelvis was performed following the standard protocol without IV contrast. COMPARISON:  CT abdomen pelvis 08/30/2017 FINDINGS: Lower chest: Negative Hepatobiliary: Limited imaging of the liver without focal lesion. Gallbladder and bile ducts normal Pancreas: Negative Spleen: Negative Adrenals/Urinary Tract: Right nephrostomy tube removed. Right ureteral stent in place in good position. The vast majority of the large staghorn calculus on the right has been removed. Small residual right lower pole renal calculi are present. Small calculi extend through the right lower pole nephrostomy defect. No obstruction of the right ureter. Foley catheter in the bladder which is decompressed. Moderate dilatation of the left renal pelvis and left ureter to the bladder. Question reimplantation of the distal left ureter. Small stone distal left ureter has passed since the prior CT Stomach/Bowel: Negative for bowel obstruction. No bowel mass or edema. Negative for pannus itis. Normal appearing appendix extending toward the inguinal canal unchanged. Vascular/Lymphatic: Negative Reproductive: Normal uterus.  No pelvic mass. Other: Negative for free fluid Musculoskeletal: Negative IMPRESSION: 1. Right ureteral stent remains in place. Vast majority of the staghorn calculus has been removed with small residual calculi in the right lower pole and along the nephrostomy tract. No right ureteral calculus. Foley catheter in the  bladder which is decompressed. 2. Moderate dilatation of the left renal collecting system without obstructing calculus. Electronically Signed   By: Marlan Palau M.D.   On: 12/23/2017 11:33     Assessment: 42 year old female with pyelonephritis.  Her stent is in good position.  Plan: Continue IV antibiotics.  No indication for urologic intervention at this time.    LOS: 1 day    Riki Altes 12/24/2017

## 2017-12-24 NOTE — Progress Notes (Signed)
[facility] - [place] at [facility]   PATIENT NAME: Jasmine Henderson    MR#:  [id number]  DATE OF BIRTH:  1976/03/21  SUBJECTIVE:   Patient is Hispanic speaking via video interpreter. Came in with fever and abdominal pain. Found to have UTI. Please of nausea and headache. REVIEW OF SYSTEMS:   Review of Systems  Constitutional: Positive for fever. Negative for chills and weight loss.  HENT: Negative for ear discharge, ear pain and nosebleeds.   Eyes: Negative for blurred vision, pain and discharge.  Respiratory: Negative for sputum production, shortness of breath, wheezing and stridor.   Cardiovascular: Negative for chest pain, palpitations, orthopnea and PND.  Gastrointestinal: Negative for abdominal pain, diarrhea, nausea and vomiting.  Genitourinary: Positive for dysuria and flank pain. Negative for frequency and urgency.  Musculoskeletal: Negative for back pain and joint pain.  Neurological: Positive for headaches. Negative for sensory change, speech change, focal weakness and weakness.  Psychiatric/Behavioral: Negative for depression and hallucinations. The patient is not nervous/anxious.    Tolerating Diet:yes Tolerating PT: not needed  DRUG ALLERGIES:  No Known Allergies  VITALS:  Blood pressure 117/74, pulse (!) 120, temperature 97.6 F (36.4 C), temperature source Oral, resp. rate 18, height 5\' 2"  (1.575 m), weight 69.4 kg (153 lb), last menstrual period 12/04/2017, SpO2 93 %.  PHYSICAL EXAMINATION:   Physical Exam  GENERAL:  42 y.o.-year-old patient lying in the bed with no acute distress.  EYES: Pupils equal, round, reactive to light and accommodation. No scleral icterus. Extraocular muscles intact.  HEENT: Head atraumatic, normocephalic. Oropharynx and nasopharynx clear.  NECK:  Supple, no jugular venous distention. No thyroid enlargement, no tenderness.  LUNGS: Normal breath sounds bilaterally, no wheezing, rales, rhonchi. No use of  accessory muscles of respiration.  CARDIOVASCULAR: S1, S2 normal. No murmurs, rubs, or gallops.  ABDOMEN: Soft, nontender, nondistended. Bowel sounds present. No organomegaly or mass.  EXTREMITIES: No cyanosis, clubbing or edema b/l.    NEUROLOGIC: Cranial nerves II through XII are intact. No focal Motor or sensory deficits b/l.   PSYCHIATRIC:  patient is alert and oriented x 3.  SKIN: No obvious rash, lesion, or ulcer.   LABORATORY PANEL:  CBC Recent Labs  Lab 12/24/17 0431  WBC 13.3*  HGB 10.8*  HCT 31.7*  PLT 181    Chemistries  Recent Labs  Lab 12/23/17 0929 12/24/17 0431  NA 132* 136  K 3.5 2.7*  CL 101 107  CO2 20* 22  GLUCOSE 183* 150*  BUN 8 6  CREATININE 0.86 0.62  CALCIUM 8.7* 7.4*  AST 34  --   ALT 17  --   ALKPHOS 83  --   BILITOT 0.6  --    Cardiac Enzymes No results for input(s): TROPONINI in the last 168 hours. RADIOLOGY:  Dg Abdomen 1 View  Result Date: 12/23/2017 CLINICAL DATA:  Constipation. EXAM: ABDOMEN - 1 VIEW COMPARISON:  None. FINDINGS: No abnormal bowel dilatation is noted. Large amount of stool seen throughout the colon. Right ureteral stent is noted in grossly good position. No abnormal calcifications are noted. IMPRESSION: No abnormal bowel dilatation is noted. Large stool burden is noted. Right ureteral stent in grossly good position. Electronically Signed   By: Lupita Raider, M.D.   On: 12/23/2017 09:51   Dg Chest Port 1 View  Result Date: 12/23/2017 CLINICAL DATA:  Fever. EXAM: PORTABLE CHEST 1 VIEW COMPARISON:  None. FINDINGS: The heart size and mediastinal contours are within normal limits. Both lungs  are clear. The visualized skeletal structures are unremarkable. IMPRESSION: No active disease. Electronically Signed   By: Lupita RaiderJames  Green Jr, M.D.   On: 12/23/2017 09:50   Ct Renal Stone Study  Result Date: 12/23/2017 CLINICAL DATA:  Nephrolithotomy and stent placement 12/18/2017. EXAM: CT ABDOMEN AND PELVIS WITHOUT CONTRAST TECHNIQUE:  Multidetector CT imaging of the abdomen and pelvis was performed following the standard protocol without IV contrast. COMPARISON:  CT abdomen pelvis 08/30/2017 FINDINGS: Lower chest: Negative Hepatobiliary: Limited imaging of the liver without focal lesion. Gallbladder and bile ducts normal Pancreas: Negative Spleen: Negative Adrenals/Urinary Tract: Right nephrostomy tube removed. Right ureteral stent in place in good position. The vast majority of the large staghorn calculus on the right has been removed. Small residual right lower pole renal calculi are present. Small calculi extend through the right lower pole nephrostomy defect. No obstruction of the right ureter. Foley catheter in the bladder which is decompressed. Moderate dilatation of the left renal pelvis and left ureter to the bladder. Question reimplantation of the distal left ureter. Small stone distal left ureter has passed since the prior CT Stomach/Bowel: Negative for bowel obstruction. No bowel mass or edema. Negative for pannus itis. Normal appearing appendix extending toward the inguinal canal unchanged. Vascular/Lymphatic: Negative Reproductive: Normal uterus.  No pelvic mass. Other: Negative for free fluid Musculoskeletal: Negative IMPRESSION: 1. Right ureteral stent remains in place. Vast majority of the staghorn calculus has been removed with small residual calculi in the right lower pole and along the nephrostomy tract. No right ureteral calculus. Foley catheter in the bladder which is decompressed. 2. Moderate dilatation of the left renal collecting system without obstructing calculus. Electronically Signed   By: Marlan Palauharles  Clark M.D.   On: 12/23/2017 11:33   ASSESSMENT AND PLAN:   Jasmine Henderson  is a 42 y.o. female with a known history of Kidney stone, recent surgery and ureteral stent last week, pre-diabetic, seizures, thyroid disease- since surgery last week, still have urinary obstruction symptoms and now worsening right sided  flank pain with fever and nausea for last 2-3    * Sepsis due to Proteus UTi   IV rocephin   Follow blood cx and UC proteus mirabilis   IV fluids  * Renal stone   Recent right ureteral stent placemntand stone removal was done by urology   CT abd shows no abnormalities--stent in good position   Spoke with Dr Virl DiamondStoiff.   * Hypothyroidism   Cont levothyroxine  * Constipation   Due to pain meds   Give miralax and senna-docusate   Case discussed with Care Management/Social Worker. Management plans discussed with the patient, family and they are in agreement.  CODE STATUS: full  DVT Prophylaxis: ambulation  TOTAL TIME TAKING CARE OF THIS PATIENT: *30* minutes.  >50% time spent on counselling and coordination of care  POSSIBLE D/C IN *1-2* DAYS, DEPENDING ON CLINICAL CONDITION.  Note: This dictation was prepared with Dragon dictation along with smaller phrase technology. Any transcriptional errors that result from this process are unintentional.  Enedina FinnerSona Lenni Reckner M.D on 12/24/2017 at 2:31 PM  Between 7am to 6pm - Pager - [phone]  After 6pm go to [email or web address] - Social research officer, governmentpassword EPAS [facility]  [facility] [place] [facility]  Office  [phone]  CC: Primary care physician; [facility]: Jasmine Henderson, female   DOB: 05/12/1976, 42 y.o.   MRN: [id number]

## 2017-12-25 LAB — URINE CULTURE

## 2017-12-25 MED ORDER — CEPHALEXIN 500 MG PO CAPS
500.0000 mg | ORAL_CAPSULE | Freq: Four times a day (QID) | ORAL | Status: DC
  Administered 2017-12-25 – 2017-12-26 (×4): 500 mg via ORAL
  Filled 2017-12-25 (×4): qty 1

## 2017-12-25 NOTE — Progress Notes (Signed)
[facility] - [place] at [facility]   PATIENT NAME: Jasmine Henderson    MR#:  [id number]  DATE OF BIRTH:  Jan 24, 1976  SUBJECTIVE:   Patient is Hispanic speaking via video interpreter. Came in with fever and abdominal pain. Found to have UTI. Mild headache REVIEW OF SYSTEMS:   Review of Systems  Constitutional: Positive for fever. Negative for chills and weight loss.  HENT: Negative for ear discharge, ear pain and nosebleeds.   Eyes: Negative for blurred vision, pain and discharge.  Respiratory: Negative for sputum production, shortness of breath, wheezing and stridor.   Cardiovascular: Negative for chest pain, palpitations, orthopnea and PND.  Gastrointestinal: Negative for abdominal pain, diarrhea, nausea and vomiting.  Genitourinary: Positive for dysuria and flank pain. Negative for frequency and urgency.  Musculoskeletal: Negative for back pain and joint pain.  Neurological: Positive for headaches. Negative for sensory change, speech change, focal weakness and weakness.  Psychiatric/Behavioral: Negative for depression and hallucinations. The patient is not nervous/anxious.    Tolerating Diet:yes Tolerating PT: not needed  DRUG ALLERGIES:  No Known Allergies  VITALS:  Blood pressure 125/86, pulse (!) 112, temperature 99.8 F (37.7 C), temperature source Oral, resp. rate 19, height 5\' 2"  (1.575 m), weight 69.4 kg (153 lb), last menstrual period 12/04/2017, SpO2 100 %.  PHYSICAL EXAMINATION:   Physical Exam  GENERAL:  42 y.o.-year-old patient lying in the bed with no acute distress.  EYES: Pupils equal, round, reactive to light and accommodation. No scleral icterus. Extraocular muscles intact.  HEENT: Head atraumatic, normocephalic. Oropharynx and nasopharynx clear.  NECK:  Supple, no jugular venous distention. No thyroid enlargement, no tenderness.  LUNGS: Normal breath sounds bilaterally, no wheezing, rales, rhonchi. No use of accessory  muscles of respiration.  CARDIOVASCULAR: S1, S2 normal. No murmurs, rubs, or gallops.  ABDOMEN: Soft, nontender, nondistended. Bowel sounds present. No organomegaly or mass.  EXTREMITIES: No cyanosis, clubbing or edema b/l.    NEUROLOGIC: Cranial nerves II through XII are intact. No focal Motor or sensory deficits b/l.   PSYCHIATRIC:  patient is alert and oriented x 3.  SKIN: No obvious rash, lesion, or ulcer.   LABORATORY PANEL:  CBC Recent Labs  Lab 12/24/17 0431  WBC 13.3*  HGB 10.8*  HCT 31.7*  PLT 181    Chemistries  Recent Labs  Lab 12/23/17 0929 12/24/17 0431  NA 132* 136  K 3.5 2.7*  CL 101 107  CO2 20* 22  GLUCOSE 183* 150*  BUN 8 6  CREATININE 0.86 0.62  CALCIUM 8.7* 7.4*  AST 34  --   ALT 17  --   ALKPHOS 83  --   BILITOT 0.6  --    Cardiac Enzymes No results for input(s): TROPONINI in the last 168 hours. RADIOLOGY:  Dg Abdomen 1 View  Result Date: 12/23/2017 CLINICAL DATA:  Constipation. EXAM: ABDOMEN - 1 VIEW COMPARISON:  None. FINDINGS: No abnormal bowel dilatation is noted. Large amount of stool seen throughout the colon. Right ureteral stent is noted in grossly good position. No abnormal calcifications are noted. IMPRESSION: No abnormal bowel dilatation is noted. Large stool burden is noted. Right ureteral stent in grossly good position. Electronically Signed   By: Lupita Raider, M.D.   On: 12/23/2017 09:51   Dg Chest Port 1 View  Result Date: 12/23/2017 CLINICAL DATA:  Fever. EXAM: PORTABLE CHEST 1 VIEW COMPARISON:  None. FINDINGS: The heart size and mediastinal contours are within normal limits. Both lungs are clear. The  visualized skeletal structures are unremarkable. IMPRESSION: No active disease. Electronically Signed   By: Lupita RaiderJames  Green Jr, M.D.   On: 12/23/2017 09:50   Ct Renal Stone Study  Result Date: 12/23/2017 CLINICAL DATA:  Nephrolithotomy and stent placement 12/18/2017. EXAM: CT ABDOMEN AND PELVIS WITHOUT CONTRAST TECHNIQUE: Multidetector  CT imaging of the abdomen and pelvis was performed following the standard protocol without IV contrast. COMPARISON:  CT abdomen pelvis 08/30/2017 FINDINGS: Lower chest: Negative Hepatobiliary: Limited imaging of the liver without focal lesion. Gallbladder and bile ducts normal Pancreas: Negative Spleen: Negative Adrenals/Urinary Tract: Right nephrostomy tube removed. Right ureteral stent in place in good position. The vast majority of the large staghorn calculus on the right has been removed. Small residual right lower pole renal calculi are present. Small calculi extend through the right lower pole nephrostomy defect. No obstruction of the right ureter. Foley catheter in the bladder which is decompressed. Moderate dilatation of the left renal pelvis and left ureter to the bladder. Question reimplantation of the distal left ureter. Small stone distal left ureter has passed since the prior CT Stomach/Bowel: Negative for bowel obstruction. No bowel mass or edema. Negative for pannus itis. Normal appearing appendix extending toward the inguinal canal unchanged. Vascular/Lymphatic: Negative Reproductive: Normal uterus.  No pelvic mass. Other: Negative for free fluid Musculoskeletal: Negative IMPRESSION: 1. Right ureteral stent remains in place. Vast majority of the staghorn calculus has been removed with small residual calculi in the right lower pole and along the nephrostomy tract. No right ureteral calculus. Foley catheter in the bladder which is decompressed. 2. Moderate dilatation of the left renal collecting system without obstructing calculus. Electronically Signed   By: Marlan Palauharles  Clark M.D.   On: 12/23/2017 11:33   ASSESSMENT AND PLAN:   Jasmine Henderson  is a 42 y.o. female with a known history of Kidney stone, recent surgery and ureteral stent last week, pre-diabetic, seizures, thyroid disease- since surgery last week, still have urinary obstruction symptoms and now worsening right sided flank pain with  fever and nausea for last 2-3    * Sepsis due to Proteus UTi   IV rocephin   Follow blood cx negative and UC proteus mirabilis   Recieved IV fluids Cont with low grade fever. Will observe for 24 hours and if remains afebrile d/c tomorrrow  * h/o Renal stone   Recent right ureteral stent placement and stone removal was done by urology   CT abd shows no abnormalities--stent in good position   Spoke with Dr Denzil MagnusonStoiff--no fneed for any intervention at present. F/u Urology as out pt on her scheduled appt  * Hypothyroidism   Cont levothyroxine  * Constipation   Due to pain meds   Give miralax and senna-docusate   Case discussed with Care Management/Social Worker. Management plans discussed with the patient, family and they are in agreement.  CODE STATUS: full  DVT Prophylaxis: ambulation  TOTAL TIME TAKING CARE OF THIS PATIENT: *30* minutes.  >50% time spent on counselling and coordination of care  POSSIBLE D/C IN *1-2* DAYS, DEPENDING ON CLINICAL CONDITION.  Note: This dictation was prepared with Dragon dictation along with smaller phrase technology. Any transcriptional errors that result from this process are unintentional.  Enedina FinnerSona Rateel Beldin M.D on 12/25/2017 at 8:42 AM  Between 7am to 6pm - Pager - [phone]  After 6pm go to [email or web address] - Social research officer, governmentpassword EPAS [facility]  [facility] [place] [facility]  Office  [phone]  CC: Primary care physician; [facility]: Jasmine Henderson,  female   DOB: 1975-07-14, 42 y.o.   MRN: [id number]

## 2017-12-26 ENCOUNTER — Telehealth: Payer: Self-pay | Admitting: Urology

## 2017-12-26 LAB — BASIC METABOLIC PANEL
Anion gap: 8 (ref 5–15)
BUN: 8 mg/dL (ref 6–20)
CHLORIDE: 104 mmol/L (ref 98–111)
CO2: 23 mmol/L (ref 22–32)
CREATININE: 0.65 mg/dL (ref 0.44–1.00)
Calcium: 8.8 mg/dL — ABNORMAL LOW (ref 8.9–10.3)
GFR calc non Af Amer: >60 mL/min (ref >60)
Glucose, Bld: 146 mg/dL — ABNORMAL HIGH (ref 70–99)
POTASSIUM: 3.4 mmol/L — AB (ref 3.5–5.1)
Sodium: 135 mmol/L (ref 135–145)

## 2017-12-26 LAB — CULTURE, URINE COMPREHENSIVE

## 2017-12-26 MED ORDER — TRAMADOL HCL 50 MG PO TABS: 50.0000 mg | ORAL_TABLET | Freq: Four times a day (QID) | ORAL | 0 refills | Status: DC | PRN

## 2017-12-26 MED ORDER — CEPHALEXIN 500 MG PO CAPS: 500.0000 mg | ORAL_CAPSULE | Freq: Three times a day (TID) | ORAL | 0 refills | Status: AC

## 2017-12-26 NOTE — Discharge Summary (Signed)
[facility] - [place] at [facility]   PATIENT NAME: Jasmine Henderson    MR#:  [id number]  DATE OF BIRTH:  [birth year]  DATE OF ADMISSION:  12/23/2017   ADMITTING PHYSICIAN: Altamese Dilling, MD  DATE OF DISCHARGE: 12/26/2017  2:18 PM  PRIMARY CARE PHYSICIAN: [facility]   ADMISSION DIAGNOSIS:   Pyelonephritis [N12] Sepsis, due to unspecified organism [facility]) [A41.9]  DISCHARGE DIAGNOSIS:   Principal Problem:   Sepsis (HCC) Active Problems:   Pyelonephritis   SECONDARY DIAGNOSIS:   Past Medical History:  Diagnosis Date  . History of kidney stones   . Hypothyroidism   . Pre-diabetes   . Seizures (HCC)    pt states she had dizzy spell, passed out and was seizing. unknown reason. NO treatment. neurologist could not find anything  . Thyroid disease     HOSPITAL COURSE:   42 year old female with past medical history significant for kidney stones status post right ureteral stent placement recently presents to hospital secondary to fevers and noted to have acute right-sided pyelonephritis  1. Sepsis-on admission secondary to Proteus UTI - blood cultures are negative. Received IV Rocephin in the hospital -with fevers and supportive treatment and antibiotics, fevers have resolved -will discharge on Keflex - also on flomax  2.  Renal stones- s/p recent right ureteral stent placement and stone removal by urology prior to this adm - CT with stent in good position - appreciate urology consult - continue flomax at discharge - f/u as outpatient in 1 week  3. Hypothyroidism- synthroid  Discharge today Used a Teacher, English as a foreign language for exam  DISCHARGE CONDITIONS:   Guarded  CONSULTS OBTAINED:   Treatment Team:  Bjorn Pippin, MD Riki Altes, MD  DRUG ALLERGIES:   No Known Allergies DISCHARGE MEDICATIONS:   Allergies as of 12/26/2017   No Known Allergies     Medication List    STOP taking these medications    HYDROcodone-acetaminophen 5-325 MG tablet Commonly known as:  NORCO/VICODIN   oxybutynin 5 MG tablet Commonly known as:  DITROPAN     TAKE these medications   cephALEXin 500 MG capsule Commonly known as:  KEFLEX Take 1 capsule (500 mg total) by mouth 3 (three) times daily for 8 days.   docusate sodium 100 MG capsule Commonly known as:  COLACE Take 1 capsule (100 mg total) by mouth 2 (two) times daily.   levothyroxine 100 MCG tablet Commonly known as:  SYNTHROID, LEVOTHROID Take 100 mcg by mouth daily before breakfast.   tamsulosin 0.4 MG Caps capsule Commonly known as:  FLOMAX Take 1 capsule (0.4 mg total) by mouth daily.   traMADol 50 MG tablet Commonly known as:  ULTRAM Take 1 tablet (50 mg total) by mouth every 6 (six) hours as needed.        DISCHARGE INSTRUCTIONS:   1. Urology f/u in 1 week  DIET:   Regular diet  ACTIVITY:   Activity as tolerated  OXYGEN:   Home Oxygen: No.  Oxygen Delivery: room air  DISCHARGE LOCATION:   home   If you experience worsening of your admission symptoms, develop shortness of breath, life threatening emergency, suicidal or homicidal thoughts you must seek medical attention immediately by calling 911 or calling your MD immediately  if symptoms less severe.  You Must read complete instructions/literature along with all the possible adverse reactions/side effects for all the Medicines you take and that have been prescribed to you. Take any new Medicines after you have  completely understood and accpet all the possible adverse reactions/side effects.   Please note  You were cared for by a hospitalist during your hospital stay. If you have any questions about your discharge medications or the care you received while you were in the hospital after you are discharged, you can call the unit and asked to speak with the hospitalist on call if the hospitalist that took care of you is not available. Once you are discharged, your  primary care physician will handle any further medical issues. Please note that NO REFILLS for any discharge medications will be authorized once you are discharged, as it is imperative that you return to your primary care physician (or establish a relationship with a primary care physician if you do not have one) for your aftercare needs so that they can reassess your need for medications and monitor your lab values.    On the day of Discharge:  VITAL SIGNS:   Blood pressure (!) 97/57, pulse 82, temperature 98.6 F (37 C), temperature source Oral, resp. rate 18, height 5\' 2"  (1.575 m), weight 69.4 kg (153 lb), last menstrual period 12/04/2017, SpO2 99 %.  PHYSICAL EXAMINATION:    GENERAL:  42 y.o.-year-old patient lying in the bed with no acute distress.  EYES: Pupils equal, round, reactive to light and accommodation. No scleral icterus. Extraocular muscles intact.  HEENT: Head atraumatic, normocephalic. Oropharynx and nasopharynx clear.  NECK:  Supple, no jugular venous distention. No thyroid enlargement, no tenderness.  LUNGS: Normal breath sounds bilaterally, no wheezing, rales,rhonchi or crepitation. No use of accessory muscles of respiration.  CARDIOVASCULAR: S1, S2 normal. No murmurs, rubs, or gallops.  ABDOMEN: Soft, minimal right flank tenderness- no guarding or rigidity, non-distended. Bowel sounds present. No organomegaly or mass.  EXTREMITIES: No pedal edema, cyanosis, or clubbing.  NEUROLOGIC: Cranial nerves II through XII are intact. Muscle strength 5/5 in all extremities. Sensation intact. Gait not checked.  PSYCHIATRIC: The patient is alert and oriented x 3.  SKIN: No obvious rash, lesion, or ulcer.   DATA REVIEW:   CBC Recent Labs  Lab 12/24/17 0431  WBC 13.3*  HGB 10.8*  HCT 31.7*  PLT 181    Chemistries  Recent Labs  Lab 12/23/17 0929  12/26/17 0947  NA 132*   < > 135  K 3.5   < > 3.4*  CL 101   < > 104  CO2 20*   < > 23  GLUCOSE 183*   < > 146*  BUN 8    < > 8  CREATININE 0.86   < > 0.65  CALCIUM 8.7*   < > 8.8*  AST 34  --   --   ALT 17  --   --   ALKPHOS 83  --   --   BILITOT 0.6  --   --    < > = values in this interval not displayed.     Microbiology Results  Results for orders placed or performed during the hospital encounter of 12/23/17  Blood Culture (routine x 2)     Status: None (Preliminary result)   Collection Time: 12/23/17  9:29 AM  Result Value Ref Range Status   Specimen Description LEFT ANTECUBITAL  Final   Special Requests   Final    BOTTLES DRAWN AEROBIC AND ANAEROBIC Blood Culture results may not be optimal due to an excessive volume of blood received in culture bottles   Culture   Final    NO GROWTH 3 DAYS  Performed at [facility], [address] [place], [place] [address]    Report Status PENDING  Incomplete  Blood Culture (routine x 2)     Status: None (Preliminary result)   Collection Time: 12/23/17  9:29 AM  Result Value Ref Range Status   Specimen Description RIGHT ANTECUBITAL  Final   Special Requests   Final    BOTTLES DRAWN AEROBIC AND ANAEROBIC Blood Culture adequate volume   Culture   Final    NO GROWTH 3 DAYS Performed at [facility], [address], [place], [place] [address]    Report Status PENDING  Incomplete  Urine culture     Status: Abnormal   Collection Time: 12/23/17  9:29 AM  Result Value Ref Range Status   Specimen Description   Final    URINE, RANDOM Performed at [facility], [address] [place], [place] [address]    Special Requests   Final    NONE Performed at [facility], [address] [place], [place] [address]    Culture >=100,000 COLONIES/mL PROTEUS MIRABILIS (A)  Final   Report Status 12/25/2017 FINAL  Final   Organism ID, Bacteria PROTEUS MIRABILIS (A)  Final      Susceptibility   Proteus mirabilis - MIC*    AMPICILLIN <=2 SENSITIVE Sensitive     CEFAZOLIN <=4 SENSITIVE Sensitive     CEFTRIAXONE <=1 SENSITIVE  Sensitive     CIPROFLOXACIN <=0.25 SENSITIVE Sensitive     GENTAMICIN <=1 SENSITIVE Sensitive     IMIPENEM 4 SENSITIVE Sensitive     NITROFURANTOIN 256 RESISTANT Resistant     TRIMETH/SULFA <=20 SENSITIVE Sensitive     AMPICILLIN/SULBACTAM <=2 SENSITIVE Sensitive     PIP/TAZO <=4 SENSITIVE Sensitive     * >=100,000 COLONIES/mL PROTEUS MIRABILIS    RADIOLOGY:  No results found.   Management plans discussed with the patient, family and they are in agreement.  CODE STATUS:     Code Status Orders  (From admission, onward)        Start     Ordered   12/23/17 1328  Full code  Continuous     12/23/17 1327    Code Status History    Date Active Date Inactive Code Status Order ID Comments User Context   11/25/2017 1605 11/26/2017 2219 Full Code [id number]  Vanna Scotland, MD Inpatient      TOTAL TIME TAKING CARE OF THIS PATIENT: 38 minutes.    Enid Baas M.D on 12/26/2017 at 3:52 PM  Between 7am to 6pm - Pager - [phone]  After 6pm go to [email or web address] - Scientist, research (life sciences) [place] [facility]  Office  [phone]  CC: Primary care physician; [facility]   Note: This dictation was prepared with Nurse, children's dictation along with smaller phrase technology. Any transcriptional errors that result from this process are unintentional.

## 2017-12-26 NOTE — Telephone Encounter (Signed)
Dr Lonna CobbStoioff will discuss with Dr Apolinar JunesBrandon. She may want to keep this appointment.

## 2017-12-26 NOTE — Progress Notes (Signed)
MD order received to discharge pt home today in [place]; verbally reviewed AVS in Spanish with pt, Rx for Ultram given to pt; no questions voiced at this time; pt's discharge pending arrival of volunteer for pt discharge

## 2017-12-26 NOTE — Progress Notes (Signed)
Pt discharged via wheelchair by volunteer to the visitor's entrance 

## 2017-12-26 NOTE — Telephone Encounter (Signed)
[facility] called for discharge follow up.  Pt already has appt w/Brandon on 7/15 that needs to be rescheduled.  I wasn't sure where to put pt.  Can you call her with appt please?

## 2017-12-27 NOTE — Telephone Encounter (Signed)
Interpreter Saia [phone] interpreted call. Advised patient to keep appointment on 12/30/2017 for now & she will be contacted if Dr Apolinar JunesBrandon wants to change the appointment. Patient voices understanding.

## 2017-12-28 LAB — CULTURE, BLOOD (ROUTINE X 2)
CULTURE: NO GROWTH
CULTURE: NO GROWTH
Special Requests: ADEQUATE

## 2017-12-30 ENCOUNTER — Encounter: Payer: Self-pay | Admitting: Urology

## 2017-12-30 ENCOUNTER — Telehealth: Payer: Self-pay | Admitting: Radiology

## 2017-12-30 ENCOUNTER — Ambulatory Visit (INDEPENDENT_AMBULATORY_CARE_PROVIDER_SITE_OTHER): Payer: Self-pay | Admitting: Urology

## 2017-12-30 VITALS — BP 126/85 | HR 94 | Ht 62.0 in | Wt 153.0 lb

## 2017-12-30 DIAGNOSIS — N200 Calculus of kidney: Secondary | ICD-10-CM

## 2017-12-30 LAB — URINALYSIS, COMPLETE
BILIRUBIN UA: NEGATIVE
Glucose, UA: NEGATIVE
Ketones, UA: NEGATIVE
NITRITE UA: NEGATIVE
Protein, UA: NEGATIVE
Urobilinogen, Ur: 0.2 mg/dL (ref 0.2–1.0)
pH, UA: 6 (ref 5.0–7.5)

## 2017-12-30 LAB — MICROSCOPIC EXAMINATION

## 2017-12-30 MED ORDER — LIDOCAINE HCL URETHRAL/MUCOSAL 2 % EX GEL
1.0000 "application " | Freq: Once | CUTANEOUS | Status: AC
  Administered 2017-12-30: 1 via URETHRAL

## 2017-12-30 NOTE — Progress Notes (Signed)
Interpreter [phone]#[id number]

## 2017-12-30 NOTE — Progress Notes (Signed)
   12/30/17  CC:  Chief Complaint  Patient presents with  . Cysto Stent Removal    HPI: 42 year old female with a complete right staghorn calculus status post right PCNL followed by staged right ureteroscopy on 12/18/2017.    Unfortunately, her postoperative course was complicated by pyelonephritis requiring inpatient admission.  Her urine culture from 12/23/2017 grew Proteus and she was discharged home on Keflex.  CT scan on 72,019 at the time of admission did show stent in good position without hydronephrosis and no residual stone burden within the right collecting system other than stone along the PCNL tract.  Last menstrual period 12/04/2017. NED. A&Ox3.   No respiratory distress   Abd soft, NT, ND Normal external genitalia with patent urethral meatus  Cystoscopy/ Stent removal procedure  Patient identification was confirmed, informed consent was obtained, and patient was prepped using Betadine solution.  Lidocaine jelly was administered per urethral meatus.    Preoperative abx where received prior to procedure.    Procedure: - Flexible cystoscope introduced, without any difficulty.   - Thorough search of the bladder revealed:    normal urethral meatus  Stent seen emanating from right ureteral orifice, grasped with stent graspers, and removed in entirety.     Post-Procedure: - Patient tolerated the procedure well   Assessment/ Plan:  1. Staghorn calculus Status post uncomplicated PCNL followed by right ureteroscopy Ureteroscopy complicated by UTI/pyelonephritis Currently on appropriate antibiotics which are culture specific Stent removed today without difficulty Discussed warning symptoms Follow-up in 4 weeks with renal ultrasound prior - Urinalysis, Complete - US RENAL; Future  Return in about 1 month (around 01/27/2018) for RUS.   Vanna ScotlandAshley Halley Kincer, MD

## 2017-12-30 NOTE — Telephone Encounter (Signed)
-----   Message from Vanna ScotlandAshley Brandon, MD sent at 12/29/2017  4:04 PM EDT ----- Let us keep the follow-up for her stent removal as long as she is been on antibiotics, Keflex as her chart indicates.  Vanna ScotlandAshley Brandon, MD  ----- Message ----- From: Nicolasa DuckingHeidel, Cuahut[place] Attar Leigh, RN Sent: 12/27/2017   4:55 PM To: Vanna ScotlandAshley Brandon, MD  Would you like to keep or reschedule the stent removal appointment for 12/30/2017?

## 2017-12-30 NOTE — Telephone Encounter (Signed)
Interpreter Chipper OmanGeorgina [phone]#[id number] with [employer] Patient states she is still taking antibiotic. Advised patient to keep appointment for stent removal today per Dr Apolinar JunesBrandon. Patient voices understanding.

## 2017-12-31 ENCOUNTER — Other Ambulatory Visit: Payer: Self-pay | Admitting: Urology

## 2018-02-03 ENCOUNTER — Ambulatory Visit
Admission: RE | Admit: 2018-02-03 | Discharge: 2018-02-03 | Disposition: A | Discharge status: Home / Self Care | Payer: Self-pay | Source: Ambulatory Visit | Attending: Urology | Admitting: Urology

## 2018-02-03 DIAGNOSIS — N132 Hydronephrosis with renal and ureteral calculous obstruction: Secondary | ICD-10-CM | POA: Insufficient documentation

## 2018-02-03 DIAGNOSIS — N200 Calculus of kidney: Secondary | ICD-10-CM

## 2018-02-03 DIAGNOSIS — N2889 Other specified disorders of kidney and ureter: Secondary | ICD-10-CM | POA: Insufficient documentation

## 2018-02-07 ENCOUNTER — Ambulatory Visit (INDEPENDENT_AMBULATORY_CARE_PROVIDER_SITE_OTHER): Payer: Self-pay | Admitting: Urology

## 2018-02-07 ENCOUNTER — Encounter: Payer: Self-pay | Admitting: Urology

## 2018-02-07 VITALS — BP 127/86 | HR 79 | Ht 62.0 in | Wt 153.0 lb

## 2018-02-07 DIAGNOSIS — N200 Calculus of kidney: Secondary | ICD-10-CM

## 2018-02-07 DIAGNOSIS — N2889 Other specified disorders of kidney and ureter: Secondary | ICD-10-CM

## 2018-02-07 DIAGNOSIS — N1330 Unspecified hydronephrosis: Secondary | ICD-10-CM

## 2018-02-07 NOTE — Progress Notes (Signed)
02/07/2018 3:11 PM   Jasmine Henderson [birth year] [id number]  Referring provider: [facility], [facility] [address] [place], [place] [address]  Chief Complaint  Patient presents with  . Results    1 month w/RUS    HPI: 42 year old patient with a history of full right staghorn calculus as well as a distal left obstructing ureteral calculus he returns today for routine follow-up after right ureteral stent removal 4 weeks ago.  Today, she does endorse some soreness over her right flank incision, otherwise has no urinary complaints including no urgency, frequency, dysuria.  No overt flank pain.  On follow-up renal ultrasound she does have some very mild bilateral hydronephrosis as well as a small left ureterocele.  She has some debris in her right kidney as well.  Stone analysis indicates that her stone is primarily infection stone, 30% magnesium ammonium phosphate, 52% calcium phosphate, 3% calcium oxalate monohydrate, 15% ammonium urate.  PMH: Past Medical History:  Diagnosis Date  . History of kidney stones   . Hypothyroidism   . Pre-diabetes   . Seizures (HCC)    pt states she had dizzy spell, passed out and was seizing. unknown reason. NO treatment. neurologist could not find anything  . Thyroid disease     Surgical History: Past Surgical History:  Procedure Laterality Date  . CYSTOSCOPY/URETEROSCOPY/HOLMIUM LASER/STENT PLACEMENT Left 09/24/2017   Procedure: CYSTOSCOPY/URETEROSCOPY/left retrorade pyleogram;  Surgeon: Riki Altes, MD;  Location: [facility] ORS;  Service: Urology;  Laterality: Left;  . CYSTOSCOPY/URETEROSCOPY/HOLMIUM LASER/STENT PLACEMENT Right 12/18/2017   Procedure: CYSTOSCOPY/URETEROSCOPY/HOLMIUM LASER/STENT Exchange;  Surgeon: Vanna Scotland, MD;  Location: [facility];  Service: Urology;  Laterality: Right;  . IR NEPHROSTOMY PLACEMENT RIGHT  11/25/2017  . NEPHROLITHOTOMY Right 11/25/2017   Procedure: NEPHROLITHOTOMY  PERCUTANEOUS;  Surgeon: Vanna Scotland, MD;  Location: [facility] ORS;  Service: Urology;  Laterality: Right;    Home Medications:  Allergies as of 02/07/2018   No Known Allergies     Medication List        Accurate as of 02/07/18 11:59 PM. Always use your most recent med list.          levothyroxine 100 MCG tablet Commonly known as:  SYNTHROID, LEVOTHROID Take 100 mcg by mouth daily before breakfast.       Allergies: No Known Allergies  Family History: Family History  Problem Relation Age of Onset  . Bladder Cancer Neg Hx   . Kidney cancer Neg Hx     Social History:  reports that she has never smoked. She has never used smokeless tobacco. She reports that she does not drink alcohol or use drugs.  ROS: UROLOGY Frequent Urination?: No Hard to postpone urination?: No Burning/pain with urination?: Yes Get up at night to urinate?: Yes Leakage of urine?: No Urine stream starts and stops?: No Trouble starting stream?: No Do you have to strain to urinate?: No Blood in urine?: No Urinary tract infection?: No Sexually transmitted disease?: No Injury to kidneys or bladder?: No Painful intercourse?: No Weak stream?: No Currently pregnant?: No Vaginal bleeding?: No Last menstrual period?: n  Gastrointestinal Nausea?: No Vomiting?: No Indigestion/heartburn?: No Diarrhea?: No Constipation?: No  Constitutional Fever: No Night sweats?: No Weight loss?: No Fatigue?: No  Skin Skin rash/lesions?: No Itching?: No  Eyes Blurred vision?: No Double vision?: No  Ears/Nose/Throat Sore throat?: No Sinus problems?: No  Hematologic/Lymphatic Swollen glands?: No Easy bruising?: No  Cardiovascular Leg swelling?: No Chest pain?: No  Respiratory Cough?: No Shortness of breath?:  No  Endocrine Excessive thirst?: No  Musculoskeletal Back pain?: No Joint pain?: No  Neurological Headaches?: No Dizziness?: No  Psychologic Depression?: No Anxiety?:  No  Physical Exam: BP 127/86   Pulse 79   Ht 5\' 2"  (1.575 m)   Wt 153 lb (69.4 kg)   BMI 27.98 kg/m   Constitutional:  Alert and oriented, No acute distress.  Accompanied by daughter today as well as Engineer, structuralpanish translator. HEENT: [place] AT, moist mucus membranes.  Trachea midline, no masses. Cardiovascular: No clubbing, cyanosis, or edema. Respiratory: Normal respiratory effort, no increased work of breathing. GI: Abdomen is soft, nontender, nondistended, no abdominal masses GU: No CVA tenderness. Right flank incision well healed.   Skin: No rashes, bruises or suspicious lesions. Neurologic: Grossly intact, no focal deficits, moving all 4 extremities. Psychiatric: Normal mood and affect.  Laboratory Data: Lab Results  Component Value Date   WBC 13.3 (H) 12/24/2017   HGB 10.8 (L) 12/24/2017   HCT 31.7 (L) 12/24/2017   MCV 88.6 12/24/2017   PLT 181 12/24/2017    Lab Results  Component Value Date   CREATININE 0.65 12/26/2017    Urinalysis n/a  Pertinent Imaging: Results for orders placed during the hospital encounter of 02/03/18  US RENAL   Narrative CLINICAL DATA:  History of right staghorn calculus status post percutaneous nephrolithotomy in lithotripsy in June.  EXAM: RENAL / URINARY TRACT ULTRASOUND COMPLETE  COMPARISON:  CT abdomen pelvis dated December 23, 2017.  FINDINGS: Right Kidney:  Length: 11.2 cm. Echogenicity within normal limits. Several shadowing echogenic foci in the lower pole measuring up to 1 cm. Mild hydronephrosis, unchanged. No mass visualized.  Left Kidney:  Length: 11.8 cm. Echogenicity within normal limits. Unchanged mild hydronephrosis. No mass visualized.  Bladder:  Small ureterocele at the insertion of the left ureter.  IMPRESSION: 1. Right nephrolithiasis measuring up to 1 cm. 2. Mild bilateral hydronephrosis. 3. Small left ureterocele.   Electronically Signed   By: Obie DredgeWilliam T Derry M.D.   On: 02/03/2018 15:10    Renal  ultrasound results were personally reviewed today with the patient.  With the above interpretation.  Assessment & Plan:    1. Staghorn calculus Stone analysis reviewed This most consistent with infection stone We discussed UTI prevention techniques including hygiene issues, probiotics and cranberry tablets twice daily She will increase her hydration significantly She was advised to come to our office if she has any signs or symptoms of UTI to facilitate treatment and antibiotic directed manner - US RENAL; Future - DG Abd 1 View; Future  2. Ureterocele Incidental left ureterocele, congenital This may explain some mild left renal pelvic fullness/mild hydronephrosis on the side Overall renal function is normal As such, we will monitor conservatively  3. Hydronephrosis, unspecified hydronephrosis type Very mild persistent right hydronephrosis status post right PCNL followed by staged right ureteroscopy This most likely is secondary to chronic dilation of the collecting system in the presence of a full staghorn calculus She is otherwise asymptomatic and her renal function is normal Plan to follow conservatively, repeat renal ultrasound in 6 months as well as KUB to assess her overall stone burden She is agreeable this plan If there is persistent hydronephrosis at this point, may consider Lasix renogram or repeat imaging study to further assess  Return in about 6 months (around 08/10/2018) for KUB/ RUS.  Vanna ScotlandAshley Ehab Humber, MD  [facility] [address], [address] [place], [place] [address] [phone]

## 2018-08-08 NOTE — Progress Notes (Signed)
08/12/2018  10:50 AM   Jasmine Henderson 09-26-1975 [id number]  Referring provider: [facility], [facility] [address] [place], [place] [address]  Chief Complaint  Patient presents with   Follow-up    HPI: Jasmine Henderson is a 43 y.o. female that presents today for routine follow up. She is accompanied by an interpreter, Maryjane Hurter.  Patient has a history of full right staghorn calculus and distal left obstructing ureteral calculus.  She had multiple procedures in 2019 including left ureteroscopy on 09/2016 by Dr. Lonna Cobb, left PCNL by me on 11/2017 followed by staged ureteroscopy on 12/2017.  Post operative course was complicated by pyelonephritis requiring inpatient admission. CT scan with stent in good position, no hydronephrosis and no residual stone burden within right collecting system other than stone along PCNL tract. Culture on 12/23/2017 grew proteus. Discharged with Keflex.  RUS (02/03/2018) with right nephrolithiasis (up to 1 cm), mild bilateral hydronephrosis (persistent s/p right PCNL and ureteroscopy), and small left ureterocele (congenital). Stone analysis with 30% magnesium ammonium phosphate, 52% calcium phosphate, 3% calcium oxalate monohydrate, 15% ammonium urate.  RUS (08/11/2018) with mild left hydronephrosis, stable, evidence for left ureterocele, small bilateral hyperechoic structure in kidneys suspicious for nonobstructive stones.  Patient reports intermittent right sided flank pain. Pain is not aggravated or alleviated by change in position or activity level.  As a burning pain which onset is suddenly  Patient denies any UTIs since her procedure.  PMH: Past Medical History:  Diagnosis Date   History of kidney stones    Hypothyroidism    Pre-diabetes    Seizures (HCC)    pt states she had dizzy spell, passed out and was seizing. unknown reason. NO treatment. neurologist could not find anything   Thyroid disease      Surgical History: Past Surgical History:  Procedure Laterality Date   CYSTOSCOPY/URETEROSCOPY/HOLMIUM LASER/STENT PLACEMENT Left 09/24/2017   Procedure: CYSTOSCOPY/URETEROSCOPY/left retrorade pyleogram;  Surgeon: Riki Altes, MD;  Location: [facility]S;  Service: Urology;  Laterality: Left;   CYSTOSCOPY/URETEROSCOPY/HOLMIUM LASER/STENT PLACEMENT Right 12/18/2017   Procedure: CYSTOSCOPY/URETEROSCOPY/HOLMIUM LASER/STENT Exchange;  Surgeon: Vanna Scotland, MD;  Location: [facility] ORS;  Service: Urology;  Laterality: Right;   IR NEPHROSTOMY PLACEMENT RIGHT  11/25/2017   NEPHROLITHOTOMY Right 11/25/2017   Procedure: NEPHROLITHOTOMY PERCUTANEOUS;  Surgeon: Vanna Scotland, MD;  Location: [facility] ORS;  Service: Urology;  Laterality: Right;    Home Medications:  Allergies as of 08/12/2018   No Known Allergies     Medication List       Accurate as of August 12, 2018 10:50 AM. Always use your most recent med list.        levothyroxine 100 MCG tablet Commonly known as:  SYNTHROID, LEVOTHROID Take 100 mcg by mouth daily before breakfast.       Allergies: No Known Allergies  Family History: Family History  Problem Relation Age of Onset   Bladder Cancer Neg Hx    Kidney cancer Neg Hx     Social History:  reports that she has never smoked. She has never used smokeless tobacco. She reports that she does not drink alcohol or use drugs.  ROS: UROLOGY Frequent Urination?: Yes Hard to postpone urination?: Yes Burning/pain with urination?: No Get up at night to urinate?: Yes Leakage of urine?: No Urine stream starts and stops?: No Trouble starting stream?: No Do you have to strain to urinate?: No Blood in urine?: No Urinary tract infection?: No Sexually transmitted disease?: No Injury to kidneys or bladder?: No Painful  intercourse?: No Weak stream?: No Currently pregnant?: No Vaginal bleeding?: No Last menstrual period?: n  Gastrointestinal Nausea?: No Vomiting?:  No Indigestion/heartburn?: No Diarrhea?: No Constipation?: Yes  Constitutional Fever: No Night sweats?: No Weight loss?: No Fatigue?: No  Skin Skin rash/lesions?: No Itching?: No  Eyes Blurred vision?: No Double vision?: No  Ears/Nose/Throat Sore throat?: Yes Sinus problems?: No  Hematologic/Lymphatic Swollen glands?: No Easy bruising?: No  Cardiovascular Leg swelling?: No Chest pain?: No  Respiratory Cough?: No Shortness of breath?: No  Endocrine Excessive thirst?: No  Musculoskeletal Back pain?: No Joint pain?: No  Neurological Headaches?: No Dizziness?: No  Psychologic Depression?: No Anxiety?: No  Physical Exam: BP 117/81 (BP Location: Left Arm, Patient Position: Sitting, Cuff Size: Normal)    Pulse 87    Ht 5\' 2"  (1.575 m)    Wt 158 lb 6.4 oz (71.8 kg)    BMI 28.97 kg/m   Constitutional:  Alert and oriented, No acute distress. Spanish speaking, accompanied by interpreter, Maryjane Hurter. Respiratory: Normal respiratory effort, no increased work of breathing. Head: Normocephalic and Atraumatic. GU: No CVA tenderness Skin: No rashes, bruises or suspicious lesions. Neurologic: Grossly intact, no focal deficits, moving all 4 extremities. Psychiatric: Normal mood and affect.  Laboratory Data: Lab Results  Component Value Date   WBC 13.3 (H) 12/24/2017   HGB 10.8 (L) 12/24/2017   HCT 31.7 (L) 12/24/2017   MCV 88.6 12/24/2017   PLT 181 12/24/2017    Lab Results  Component Value Date   CREATININE 0.65 12/26/2017   Pertinent Imaging: CLINICAL DATA:  History of kidney stones.  EXAM: RENAL / URINARY TRACT ULTRASOUND COMPLETE  COMPARISON:  02/03/2018 and CT 12/23/2017  FINDINGS: Right Kidney:  Renal measurements: 10.1 x 5.4 x 5.1 cm = volume: 147 mL. Normal echogenicity. Fullness of the right renal pelvis without hydronephrosis. Right renal fullness measures up to 1.9 cm and previously measured 2.4 cm. 7 mm hyperechoic structure in  the mid/lower pole the right kidney could represent a small stone.  Left Kidney:  Renal measurements: 12.7 x 5.9 x 5.3 cm = volume: 208 mL. Normal echogenicity. Mild left hydronephrosis is similar to the prior ultrasound. Echogenic focus in the left kidney measures 4 mm and could represent a small stone.  Bladder:  Bladder is fluid-filled. There is a small cystic structure near the left ureterovesical junction. Based on operative report from 09/24/2017, the patient has a left ureterocele. Sonographic findings are suggestive for a ureterocele measuring to 1.6 cm.  IMPRESSION: 1. Stable mild left hydronephrosis and evidence for a left ureterocele. 2. Right hydronephrosis has resolved but there is residual right pelvic fullness. 3. Focal small hyperechoic structure in both kidneys. Findings could represent small nonobstructive kidney stones.   Electronically Signed   By: Richarda Overlie M.D.   On: 08/12/2018 08:57  I have personally reviewed all the images today with the patient.  KUB today, awaiting radiologic interpretation. 5-mm stone in left kidney possibly along PCNL tract, nonobstructing. No obvious stone on left.    Assessment & Plan:   1. History of Kidney Stone/Staghorn  - s/p right PCNL and ureteroscopy  - RUS (07/2018) with hydronephrosis secondary to staghorn has resolved on right side  - KUB today, awaiting radiologic interpretation.   - 5-mm stone in left kidney seen on KUB images, likely along PCNL tract, non obstructing. Asymptomatic. No need for further intervention at this time.  - Advised patient to increase their water intake until the urine is pale yellow or  clear (10 to 12 cups daily), take probiotics (yogurt, oral pills or vaginal suppositories), take cranberry pills or drink the juice and Vitamin C 1,000 mg daily to acidify the urine   - We reviewed symptoms of UTI (fevers, chills, gross hematuria, mental status changes, dysuria, suprapubic pain, back  pain and/or sudden worsening of urinary symptoms) and advised not to have urine checked or be treated for UTI if not experiencing symptoms. 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  3. Left Hydronephrosis  - Mild, persistent and chronic. It is asymptommatic and without renal compromise  - Plan as above  4. Flank Pain  - Intermittent discomfort likely due to to scarring of the kidney itself. Anticipate pain resolution with time             -No obstructing stones identified  Return in about 1 year (around 08/13/2019) for KUB.  [facility] [address], [address] [place], [place] [address] [phone]  I, Robbi Garteremidayo Atanda[place] , am acting as a scribe for Vanna ScotlandAshley Brandon, MD  I have reviewed the above documentation for accuracy and completeness, and I agree with the above.   Vanna ScotlandAshley Brandon, MD

## 2018-08-11 ENCOUNTER — Ambulatory Visit
Admission: RE | Admit: 2018-08-11 | Discharge: 2018-08-11 | Disposition: A | Discharge status: Home / Self Care | Payer: Self-pay | Source: Ambulatory Visit | Attending: Urology | Admitting: Urology

## 2018-08-11 DIAGNOSIS — N200 Calculus of kidney: Secondary | ICD-10-CM | POA: Insufficient documentation

## 2018-08-12 ENCOUNTER — Ambulatory Visit
Admission: RE | Admit: 2018-08-12 | Discharge: 2018-08-12 | Disposition: A | Discharge status: Home / Self Care | Payer: Self-pay | Source: Ambulatory Visit | Attending: Urology | Admitting: Urology

## 2018-08-12 ENCOUNTER — Encounter: Payer: Self-pay | Admitting: Urology

## 2018-08-12 ENCOUNTER — Ambulatory Visit (INDEPENDENT_AMBULATORY_CARE_PROVIDER_SITE_OTHER): Payer: Self-pay | Admitting: Urology

## 2018-08-12 VITALS — BP 117/81 | HR 87 | Ht 62.0 in | Wt 158.4 lb

## 2018-08-12 DIAGNOSIS — N200 Calculus of kidney: Secondary | ICD-10-CM

## 2018-08-12 DIAGNOSIS — R109 Unspecified abdominal pain: Secondary | ICD-10-CM

## 2018-08-12 DIAGNOSIS — N2889 Other specified disorders of kidney and ureter: Secondary | ICD-10-CM

## 2018-08-12 DIAGNOSIS — N390 Urinary tract infection, site not specified: Secondary | ICD-10-CM

## 2018-08-12 NOTE — Patient Instructions (Signed)
Infeccin urinaria en los adultos  Urinary Tract Infection, Adult  Una infeccin urinaria (IU) puede ocurrir en cualquier lugar de las vas urinarias. Las vas urinarias incluyen lo siguiente:   Los riones.   Los urteres.   La vejiga.   La uretra.  Estos rganos fabrican, almacenan y eliminan el pis (orina) del cuerpo.  Cules son las causas?  La causa es la presencia de grmenes (bacterias) en la zona genital. Estos grmenes proliferan y causan hinchazn (inflamacin) de las vas urinarias.  Qu incrementa el riesgo?  Es ms probable que contraiga esta afeccin si:   Tiene colocado un tubo delgado y pequeo (catter) para drenar el pis.   Nopuede controlar la evacuacin de pis ni de materia fecal (incontinencia).   Es mujer y, adems:  ? Usa estos mtodos para evitar el embarazo:  ? Un medicamento que mata los espermatozoides (espermicida).  ? Un dispositivo que impide el paso de los espermatozoides (diafragma).  ? Tieneniveles bajos de una hormona femenina (estrgeno).  ? Est embarazada.   Tiene genes que aumentan su riesgo.   Es sexualmente activa.   Toma antibiticos.   Tiene dificultad para orinar debido a:  ? Su prstata es ms grande de lo normal, si usted es hombre.  ? Obstruccin en la parte del cuerpo que drena el pis de la vejiga (uretra).  ? Clculo renal.  ? Untrastorno nervioso que afecta la vejiga (vejiga neurgena).  ? No bebe una cantidad suficiente de lquido.  ? No hace pis con la frecuencia suficiente.   Tiene otras afecciones, como:  ? Diabetes.  ? Un sistema que combate las enfermedades (sistema inmunitario) debilitado.  ? Anemia drepanoctica.  ? Gota.  ? Lesin en la columna vertebral.  Cules son los signos o los sntomas?  Los sntomas de esta afeccin incluyen:   Necesidad inmediata (urgente) de hacer pis.   Hacer pis con frecuencia.   Hacer poca cantidad de pis con mucha frecuencia.   Dolor o ardor al hacer pis.   Sangre en el pis.   Pis que huele mal o  anormal.   Dificultad para hacer pis.   Pis turbio.   Lquido que sale de la vagina, si es mujer.   Dolor en la barriga o en la parte baja de la espalda.  Otros sntomas pueden incluir los siguientes:   Ganas de devolver (vmitos).   No sentir deseos de comer.   Sentirse confundido (confuso).   Sentirse cansado y malhumorado (irritable).   Fiebre.   Materia fecal lquida (diarrea).  Cmo se trata?  El tratamiento de esta afeccin puede incluir:   Antibiticos.   Otros medicamentos.   Beber una cantidad suficiente de agua.  Siga estas indicaciones en su casa:    Medicamentos   Tome los medicamentos de venta libre y los recetados solamente como se lo haya indicado el mdico.   Si le recetaron un antibitico, tmelo como se lo haya indicado el mdico. No deje de tomarlo aunque comience a sentirse mejor.  Indicaciones generales   Asegrese de hacer lo siguiente:  ? Haga pis hasta que la vejiga quede vaca.  ? Nocontenga el pis durante mucho tiempo.  ? Vace la vejiga despus de tener relaciones sexuales.  ? Lmpiese de adelante hacia atrs despus de defecar, si es mujer. Use cada trozo de papel higinico solo una vez cuando se limpie.   Beba suficiente lquido como para mantener la orina de color amarillo plido.   Concurra a todas las   visitas de control como se lo haya indicado el mdico. Esto es importante.  Comunquese con un mdico si:   No mejora despus de 1 o 2das de tratamiento.   Los sntomas desaparecen y luego reaparecen.  Solicite ayuda inmediatamente si:   Tiene un dolor muy intenso en la espalda.   Tiene dolor muy intenso en la parte baja de la barriga.   Tiene fiebre.   Tiene malestar estomacal (nuseas).   Tiene vmitos.  Resumen   Una infeccin urinaria (IU) puede ocurrir en cualquier lugar de las vas urinarias.   Esta afeccin es causada por la presencia de grmenes en la zona genital.   Existen muchos factores de riesgo de sufrir una IU. Estos incluyen tener colocado  un tubo delgado y pequeo para drenar el pis y no poder controlar cundo hace pis y materia fecal.   El tratamiento incluye antibiticos contra los grmenes.   Beba suficiente lquido como para mantener la orina de color amarillo plido.  Esta informacin no tiene como fin reemplazar el consejo del mdico. Asegrese de hacerle al mdico cualquier pregunta que tenga.  Document Released: 11/22/2009 Document Revised: 02/05/2018 Document Reviewed: 02/05/2018  Elsevier Interactive Patient Education  2019 Elsevier Inc[employer]

## 2019-05-07 ENCOUNTER — Encounter: Payer: Self-pay | Admitting: *Deleted

## 2019-08-13 ENCOUNTER — Ambulatory Visit: Payer: Self-pay | Admitting: Urology

## 2019-08-14 ENCOUNTER — Ambulatory Visit
Admission: RE | Admit: 2019-08-14 | Discharge: 2019-08-14 | Disposition: A | Discharge status: Home / Self Care | Payer: Self-pay | Source: Ambulatory Visit | Attending: Urology | Admitting: Urology

## 2019-08-14 DIAGNOSIS — N200 Calculus of kidney: Secondary | ICD-10-CM | POA: Insufficient documentation

## 2019-08-18 NOTE — Progress Notes (Signed)
08/19/2019 12:59 PM   Jasmine Henderson 1975/09/08 [id number]  Referring provider: [facility] [address],  [place] [address]  Chief Complaint  Patient presents with  . Nephrolithiasis    HPI: Jasmine Henderson is a 44 yo Hispanic F with a personal history of kidney stone/staghorn, uterocele, hydronephrosis and flank pain returns today for an annual routine f/u. She is accompanied by an interpreter.   Patient has a history of full right staghorn calculus and distal left obstructing ureteral calculus.  She had multiple procedures in 2019 including left ureteroscopy on 09/2016 by Dr. Bernardo Heater, left PCNL by me on 11/2017 followed by staged ureteroscopy on 12/2017.   RUS (02/03/2018) with right nephrolithiasis (up to 1 cm), mild bilateral hydronephrosis (persistent s/p right PCNL and ureteroscopy), and small left ureterocele (congenital).   Stone analysis with 30% magnesium ammonium phosphate, 52% calcium phosphate, 3% calcium oxalate monohydrate, 15% ammonium urate.  RUS (08/12/2018) with mild left hydronephrosis, stable, evidence for left ureterocele, small bilateral hyperechoic structure in kidneys suspicious for nonobstructive stones.  Most recent KUB (08/14/2019) shows stable parenchymal calcification right kidney compatible with prior instrumentation. No definite tract calculi.   Pt believes she has passed some stones as she reports feeling gravel-like substance when wiping not accompanied by pain.   She reports of mild flank pain, intermittent urinary burning and frequency.  The symptoms come and go, are not necessarily related to 1 another with varying frequencies but or not severe.  Treatment she is menstruating today.  She denies being treated for urinary tract infection this year.   PVR today 26 mL.   PMH: Past Medical History:  Diagnosis Date  . History of kidney stones   . Hypothyroidism   . Pre-diabetes   .  Seizures ([place])    pt states she had dizzy spell, passed out and was seizing. unknown reason. NO treatment. neurologist could not find anything  . Thyroid disease     Surgical History: Past Surgical History:  Procedure Laterality Date  . CYSTOSCOPY/URETEROSCOPY/HOLMIUM LASER/STENT PLACEMENT Left 09/24/2017   Procedure: CYSTOSCOPY/URETEROSCOPY/left retrorade pyleogram;  Surgeon: Abbie Sons, MD;  Location: [facility];  Service: Urology;  Laterality: Left;  . CYSTOSCOPY/URETEROSCOPY/HOLMIUM LASER/STENT PLACEMENT Right 12/18/2017   Procedure: CYSTOSCOPY/URETEROSCOPY/HOLMIUM LASER/STENT Exchange;  Surgeon: Hollice Espy, MD;  Location: [facility];  Service: Urology;  Laterality: Right;  . IR NEPHROSTOMY PLACEMENT RIGHT  11/25/2017  . NEPHROLITHOTOMY Right 11/25/2017   Procedure: NEPHROLITHOTOMY PERCUTANEOUS;  Surgeon: Hollice Espy, MD;  Location: [facility];  Service: Urology;  Laterality: Right;    Home Medications:  Allergies as of 08/19/2019   No Known Allergies     Medication List       Accurate as of August 19, 2019 12:59 PM. If you have any questions, ask your nurse or doctor.        levothyroxine 100 MCG tablet Commonly known as: SYNTHROID Take 100 mcg by mouth daily before breakfast.       Allergies: No Known Allergies  Family History: Family History  Problem Relation Age of Onset  . Bladder Cancer Neg Hx   . Kidney cancer Neg Hx     Social History:  reports that she has never smoked. She has never used smokeless tobacco. She reports that she does not drink alcohol or use drugs.   Physical Exam: BP 131/89   Pulse 96   Ht 5\' 2"  (1.575 m)   Wt 164 lb (74.4 kg)   BMI 30.00 kg/m  Constitutional:  Alert and oriented, No acute distress. HEENT: [place] AT, moist mucus membranes.  Trachea midline, no masses. Cardiovascular: No clubbing, cyanosis, or edema. Respiratory: Normal respiratory effort, no increased work of breathing. Skin: No rashes, bruises or suspicious  lesions. Neurologic: Grossly intact, no focal deficits, moving all 4 extremities. Psychiatric: Normal mood and affect.  Laboratory Data:  Urinalysis UA shows +3 RBC, +2 leukocytes on dip. 6-20 WBC, >10 Epithelial cells, moderate bacteria on dip.   Pertinent Imaging: Results for orders placed or performed in visit on 08/19/19  BLADDER SCAN AMB NON-IMAGING  Result Value Ref Range   Scan Result 26 ml     Results for orders placed during the hospital encounter of 08/14/19  Abdomen 1 view (KUB)   Narrative CLINICAL DATA:  History of right-sided nephrolithiasis  EXAM: ABDOMEN - 1 VIEW  COMPARISON:  08/12/2018  FINDINGS: Supine frontal view of the abdomen and pelvis excludes the hemidiaphragms by collimation. Stable parenchymal calcification overlying right renal silhouette. No definite urinary tract calculi. Bowel gas pattern is unremarkable.  IMPRESSION: 1. Stable parenchymal calcification right kidney compatible with prior instrumentation. 2. No definite urinary tract calculi.   Electronically Signed   By: Sharlet Salina M.D.   On: 08/14/2019 22:56     I have personally reviewed the images and agree with radiologist interpretation.   Assessment & Plan:    1. History of Kidney Stone/Staghorn  KUB stable -stable intraparenchymal calcification along the PCNL tract, unchanged without any significant stone burden Adequate emptying bladder, PVR 26 mL Reviewed stone diet recommendations; encouraged increased water and citric acid intake F/u with KUB in 1 year   2. Right Flank Pain Ordered RUS; will call with results Expect mild hydronephrosis   Suspect right flank pain is chronic  3. Dysuria  Intermittent not severe  Urine borderline will send for urine culture and treat as needed  1 year with KUB ( will call with RUS results)  [facility] [address], [address] [place], [place] [address] [phone]  I, Donne Hazel, am  acting as a scribe for Dr. Vanna Scotland,  I have reviewed the above documentation for accuracy and completeness, and I agree with the above.   Vanna Scotland, MD

## 2019-08-19 ENCOUNTER — Ambulatory Visit (INDEPENDENT_AMBULATORY_CARE_PROVIDER_SITE_OTHER): Payer: Self-pay | Admitting: Urology

## 2019-08-19 ENCOUNTER — Encounter: Payer: Self-pay | Admitting: Urology

## 2019-08-19 ENCOUNTER — Other Ambulatory Visit: Payer: Self-pay

## 2019-08-19 VITALS — BP 131/89 | HR 96 | Ht 62.0 in | Wt 164.0 lb

## 2019-08-19 DIAGNOSIS — N390 Urinary tract infection, site not specified: Secondary | ICD-10-CM

## 2019-08-19 DIAGNOSIS — N200 Calculus of kidney: Secondary | ICD-10-CM

## 2019-08-19 LAB — URINALYSIS, COMPLETE
Bilirubin, UA: NEGATIVE
Glucose, UA: NEGATIVE
Ketones, UA: NEGATIVE
Nitrite, UA: NEGATIVE
Protein,UA: NEGATIVE
Specific Gravity, UA: 1.02 (ref 1.005–1.030)
Urobilinogen, Ur: 0.2 mg/dL (ref 0.2–1.0)
pH, UA: 7 (ref 5.0–7.5)

## 2019-08-19 LAB — MICROSCOPIC EXAMINATION: Epithelial Cells (non renal): >10 /hpf — AB (ref 0–10)

## 2019-08-19 LAB — BLADDER SCAN AMB NON-IMAGING: Scan Result: 26

## 2019-08-23 LAB — CULTURE, URINE COMPREHENSIVE

## 2019-08-24 ENCOUNTER — Telehealth: Payer: Self-pay | Admitting: *Deleted

## 2019-08-24 MED ORDER — SULFAMETHOXAZOLE-TRIMETHOPRIM 800-160 MG PO TABS: 1.0000 | ORAL_TABLET | Freq: Two times a day (BID) | ORAL | 0 refills | Status: DC

## 2019-08-24 NOTE — Telephone Encounter (Addendum)
Patient notified via interpreter services [phone] Jasmine Henderson in RX to pharmacy as requested.  Voiced understanding.   ----- Message from Vanna Scotland, MD sent at 08/24/2019  7:48 AM EST ----- Growing proteus in urine, please treat with bactrim ds bid x 5 days  Vanna Scotland, MD

## 2019-08-27 ENCOUNTER — Other Ambulatory Visit: Payer: Self-pay

## 2019-08-27 ENCOUNTER — Ambulatory Visit
Admission: RE | Admit: 2019-08-27 | Discharge: 2019-08-27 | Disposition: A | Discharge status: Home / Self Care | Payer: Self-pay | Source: Ambulatory Visit | Attending: Urology | Admitting: Urology

## 2019-08-27 DIAGNOSIS — N390 Urinary tract infection, site not specified: Secondary | ICD-10-CM | POA: Insufficient documentation

## 2019-09-04 ENCOUNTER — Ambulatory Visit: Payer: Self-pay | Attending: Internal Medicine

## 2019-09-04 DIAGNOSIS — Z23 Encounter for immunization: Secondary | ICD-10-CM

## 2019-09-04 NOTE — Progress Notes (Signed)
   Covid-19 Vaccination Clinic  Name:  Jasmine Henderson    MRN: [id number] DOB: 07-27-1975  09/04/2019  [name] was observed post Covid-19 immunization for 15 minutes without incident. She was provided with Vaccine Information Sheet and instruction to access the V-Safe system.   [name] was instructed to call 911 with any severe reactions post vaccine: Marland Kitchen Difficulty breathing  . Swelling of face and throat  . A fast heartbeat  . A bad rash all over body  . Dizziness and weakness   Immunizations Administered    Name Date Dose VIS Date Route   Pfizer COVID-19 Vaccine 09/04/2019  2:09 PM 0.3 mL 05/29/2019 Intramuscular   Manufacturer: [employer], [employer]   Lot: [id number]   NDC: [address]-6

## 2019-09-10 ENCOUNTER — Telehealth: Payer: Self-pay | Admitting: *Deleted

## 2019-09-10 NOTE — Telephone Encounter (Addendum)
Patient informed via interpreter services [phone] understanding.   ----- Message from Vanna Scotland, MD sent at 09/10/2019  2:57 PM EDT ----- Please let this patient know I reviewed her MRI.  He would likely need a translator for this discussion.  The l kidneys look about the same, chronically dilated but otherwise unremarkable.  Vanna Scotland, MD

## 2019-09-25 ENCOUNTER — Ambulatory Visit: Payer: Self-pay | Attending: Internal Medicine

## 2019-09-25 DIAGNOSIS — Z23 Encounter for immunization: Secondary | ICD-10-CM

## 2019-09-25 NOTE — Progress Notes (Signed)
   Covid-19 Vaccination Clinic  Name:  Estreya Clay    MRN: [id number] DOB: Apr 15, 1976  09/25/2019  [name] was observed post Covid-19 immunization for 15 minutes without incident. She was provided with Vaccine Information Sheet and instruction to access the V-Safe system.   Ms. Macari was instructed to call 911 with any severe reactions post vaccine: Marland Kitchen Difficulty breathing  . Swelling of face and throat  . A fast heartbeat  . A bad rash all over body  . Dizziness and weakness   Immunizations Administered    Name Date Dose VIS Date Route   Pfizer COVID-19 Vaccine 09/25/2019  1:49 PM 0.3 mL 05/29/2019 Intramuscular   Manufacturer: [employer], [employer]   Lot: [id number]   NDC: [address]-9

## 2019-10-01 IMAGING — CT CT RENAL STONE PROTOCOL
3 of 4 series · 7 of 46 positions shown, 13 images · non-contrast
Comparison: CT abdomen pelvis 08/30/2017

CLINICAL DATA: Nephrolithotomy and stent placement 12/18/2017.

EXAM:
CT ABDOMEN AND PELVIS WITHOUT CONTRAST
TECHNIQUE: Multidetector CT imaging of the abdomen and pelvis was performed
following the standard protocol without IV contrast.

[Series 4: lung bases · axial · 0.77mm/px · z∈[-696,-661]mm · 3 of 15 slices shown, 7 images]
[im 4/15  soft-tissue]
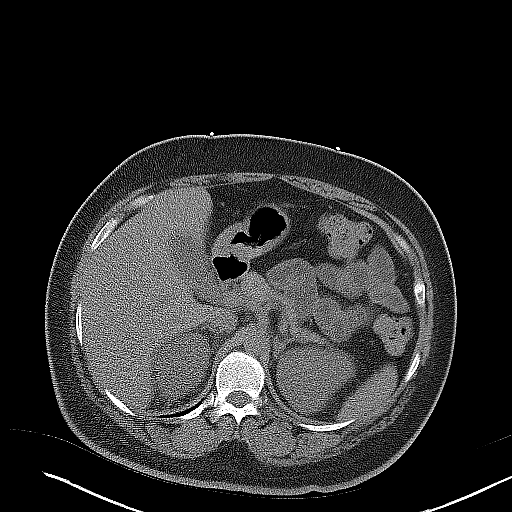
[im 4/15  lung]
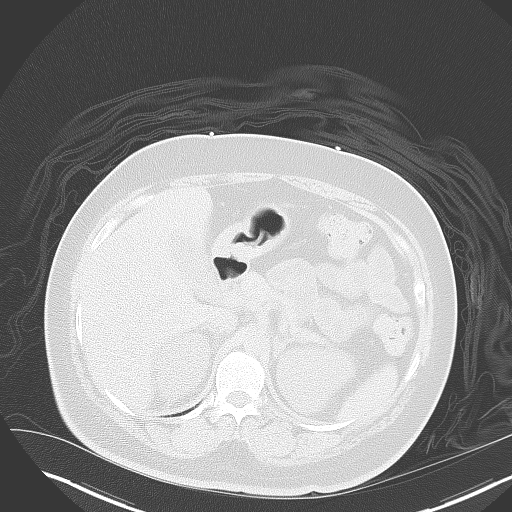
[im 4/15  bone]
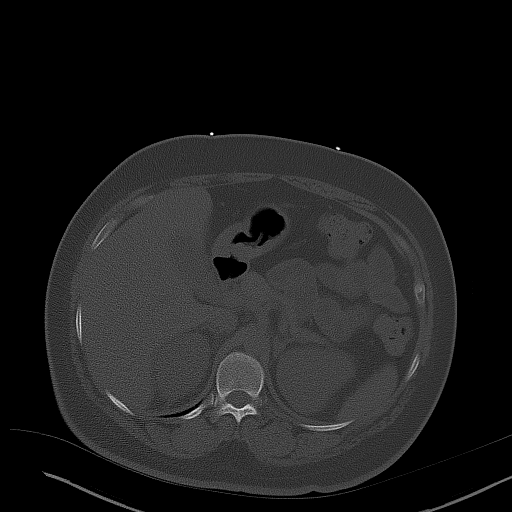
[im 8/15  soft-tissue]
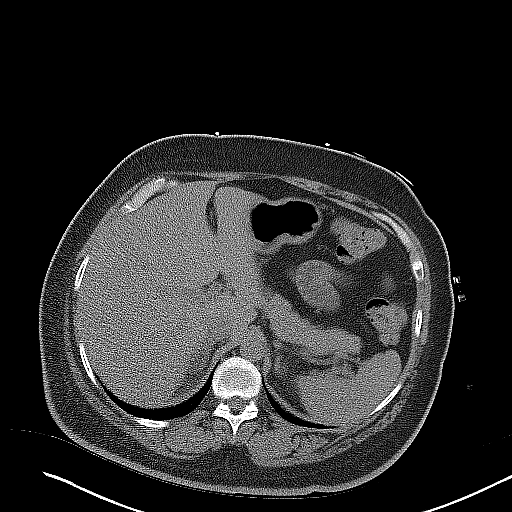
[im 8/15  lung]
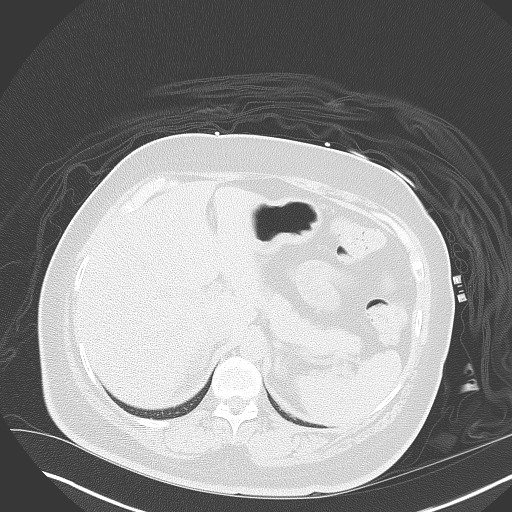
[im 11/15  soft-tissue]
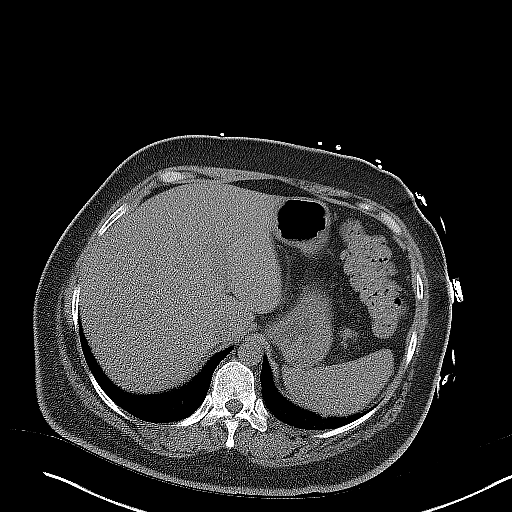
[im 11/15  lung]
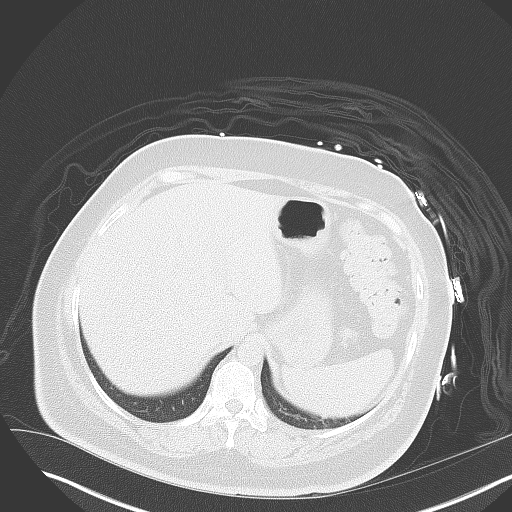

[Series 5: coronal · coronal · 0.67mm/px · 3 of 115 slices shown, 4 images]
[im 39/115  soft-tissue]
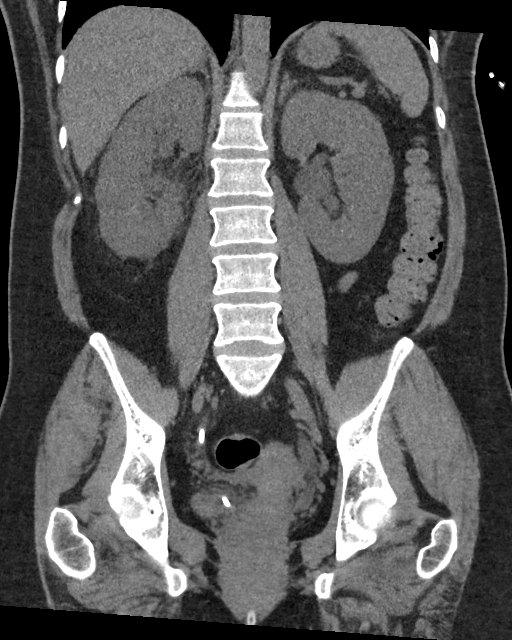
[im 51/115  soft-tissue]
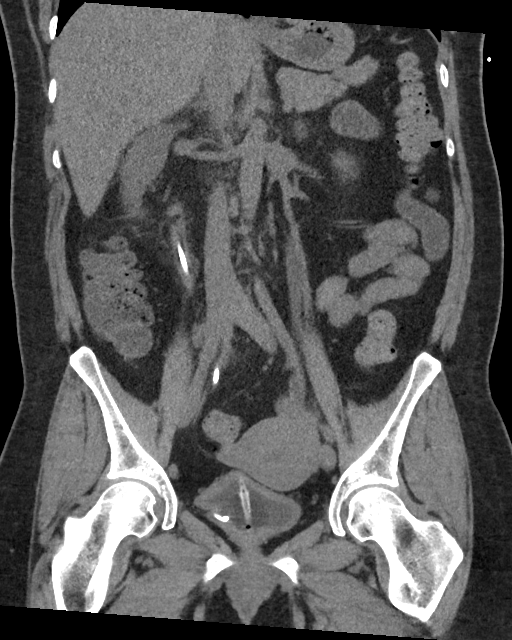
[im 51/115  bone]
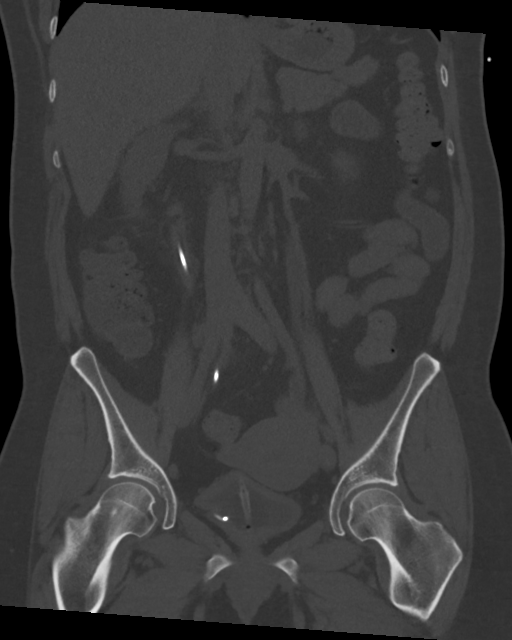
[im 64/115  soft-tissue]
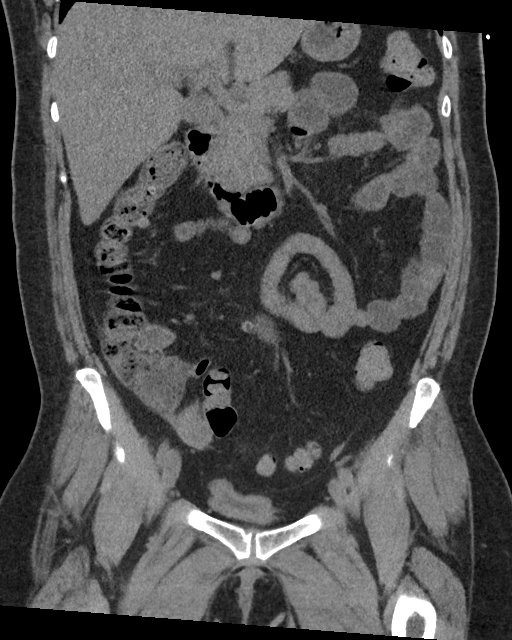

[Series 6: sagittal · sagittal · 0.60mm/px · 1 of 152 slices shown, 2 images]
[im 51/152  soft-tissue]
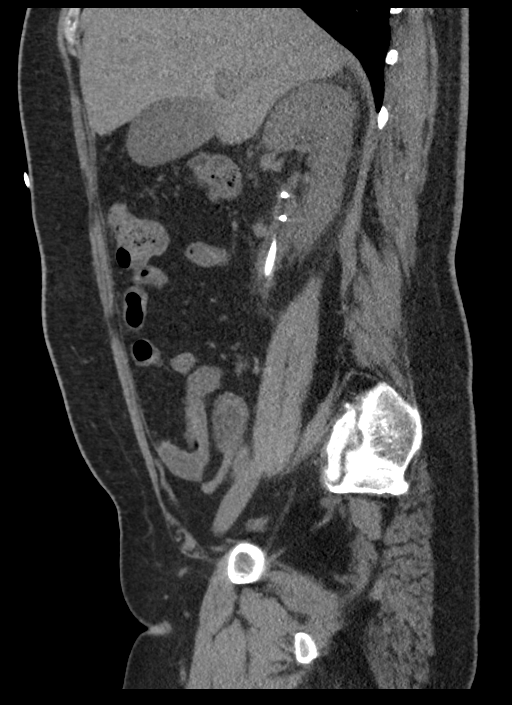
[im 51/152  bone]
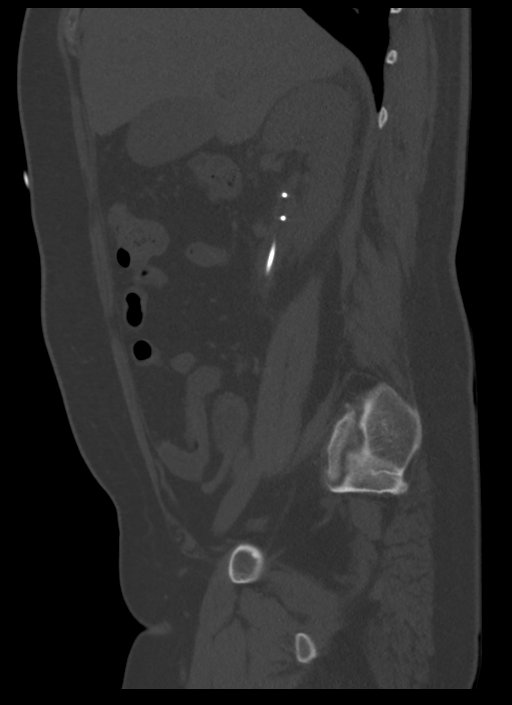

[7 of 46 positions shown; findings below may reference images not displayed]

FINDINGS: Lower chest: Negative

Hepatobiliary: Limited imaging of the liver without focal lesion.
Gallbladder and bile ducts normal

Pancreas: Negative

Spleen: Negative

Adrenals/Urinary Tract: Right nephrostomy tube removed. Right
ureteral stent in place in good position. The vast majority of the
large staghorn calculus on the right has been removed. Small
residual right lower pole renal calculi are present. Small calculi
extend through the right lower pole nephrostomy defect. No
obstruction of the right ureter.

Foley catheter in the bladder which is decompressed.

Moderate dilatation of the left renal pelvis and left ureter to the
bladder. Question reimplantation of the distal left ureter. Small
stone distal left ureter has passed since the prior CT

Stomach/Bowel: Negative for bowel obstruction. No bowel mass or
edema. Negative for pannus itis. Normal appearing appendix extending
toward the inguinal canal unchanged.

Vascular/Lymphatic: Negative

Reproductive: Normal uterus.  No pelvic mass.

Other: Negative for free fluid

Musculoskeletal: Negative
IMPRESSION: 1. Right ureteral stent remains in place. Vast majority of the
staghorn calculus has been removed with small residual calculi in
the right lower pole and along the nephrostomy tract. No right
ureteral calculus. Foley catheter in the bladder which is
decompressed.
2. Moderate dilatation of the left renal collecting system without
obstructing calculus.

## 2020-05-09 ENCOUNTER — Encounter: Payer: Self-pay | Admitting: Physician Assistant

## 2020-05-09 ENCOUNTER — Other Ambulatory Visit: Payer: Self-pay

## 2020-05-09 ENCOUNTER — Ambulatory Visit
Admission: RE | Admit: 2020-05-09 | Discharge: 2020-05-09 | Disposition: A | Discharge status: Home / Self Care | Payer: Self-pay | Source: Ambulatory Visit | Attending: Physician Assistant | Admitting: Physician Assistant

## 2020-05-09 ENCOUNTER — Ambulatory Visit (INDEPENDENT_AMBULATORY_CARE_PROVIDER_SITE_OTHER): Payer: Self-pay | Admitting: Physician Assistant

## 2020-05-09 ENCOUNTER — Other Ambulatory Visit: Payer: Self-pay | Admitting: Physician Assistant

## 2020-05-09 ENCOUNTER — Ambulatory Visit
Admission: RE | Admit: 2020-05-09 | Discharge: 2020-05-09 | Disposition: A | Discharge status: Home / Self Care | Payer: Self-pay | Source: Ambulatory Visit | Attending: Urology | Admitting: Urology

## 2020-05-09 VITALS — BP 106/86 | HR 114 | Ht 65.0 in | Wt 155.0 lb

## 2020-05-09 DIAGNOSIS — Z87442 Personal history of urinary calculi: Secondary | ICD-10-CM | POA: Insufficient documentation

## 2020-05-09 DIAGNOSIS — R300 Dysuria: Secondary | ICD-10-CM

## 2020-05-09 DIAGNOSIS — R109 Unspecified abdominal pain: Secondary | ICD-10-CM

## 2020-05-09 MED ORDER — SULFAMETHOXAZOLE-TRIMETHOPRIM 800-160 MG PO TABS: 1.0000 | ORAL_TABLET | Freq: Two times a day (BID) | ORAL | 0 refills | Status: AC

## 2020-05-09 NOTE — Progress Notes (Signed)
05/09/2020 1:01 PM   Jasmine Henderson July 13, 1975 [id number]  CC: Chief Complaint  Patient presents with  . Recurrent UTI   HPI: Jasmine Henderson is a 44 y.o. female with PMH nephrolithiasis including full right staghorn stone, left ureterocele, dysuria, and chronic left hydronephrosis who presents today for evaluation of possible UTI.    Today she reports an approximate 2-day history of chills, headache, nausea, bilateral flank pain, and worsened dysuria over baseline. She denies vomiting, fever, and gross hematuria. She has been taking Tylenol at home for management of her symptoms, most recently this morning.  She states she has had multiple episodes with similar symptoms since her last clinic visit with [place] in March 2021. She states these have resolved on their own. She wonders if these represent recurrent urinary tract infections and how she can prevent them. She reports a history of intermittent constipation. No association with sexual activity. She does not take baths.  KUB today with stable appearing right PCNL tract calcifications and no apparent ureteral stones.  In-office UA today positive for 3+ blood and 1+ leukocyte esterase; urine microscopy with 11-30 WBCs/HPF and 11-30 RBCs/HPF.  PMH: Past Medical History:  Diagnosis Date  . History of kidney stones   . Hypothyroidism   . Pre-diabetes   . Seizures ([facility])    pt states she had dizzy spell, passed out and was seizing. unknown reason. NO treatment. neurologist could not find anything  . Thyroid disease     Surgical History: Past Surgical History:  Procedure Laterality Date  . CYSTOSCOPY/URETEROSCOPY/HOLMIUM LASER/STENT PLACEMENT Left 09/24/2017   Procedure: CYSTOSCOPY/URETEROSCOPY/left retrorade pyleogram;  Surgeon: Riki Altes, MD;  Location: [facility];  Service: Urology;  Laterality: Left;  . CYSTOSCOPY/URETEROSCOPY/HOLMIUM LASER/STENT PLACEMENT Right 12/18/2017   Procedure:  CYSTOSCOPY/URETEROSCOPY/HOLMIUM LASER/STENT Exchange;  Surgeon: Vanna Scotland, MD;  Location: [facility];  Service: Urology;  Laterality: Right;  . IR NEPHROSTOMY PLACEMENT RIGHT  11/25/2017  . NEPHROLITHOTOMY Right 11/25/2017   Procedure: NEPHROLITHOTOMY PERCUTANEOUS;  Surgeon: Vanna Scotland, MD;  Location: [facility];  Service: Urology;  Laterality: Right;    Home Medications:  Allergies as of 05/09/2020   No Known Allergies     Medication List       Accurate as of May 09, 2020 11:59 PM. If you have any questions, ask your nurse or doctor.        levothyroxine 100 MCG tablet Commonly known as: SYNTHROID Take 100 mcg by mouth daily before breakfast.   sulfamethoxazole-trimethoprim 800-160 MG tablet Commonly known as: BACTRIM DS Take 1 tablet by mouth 2 (two) times daily for 7 days.       Allergies:  No Known Allergies  Family History: Family History  Problem Relation Age of Onset  . Bladder Cancer Neg Hx   . Kidney cancer Neg Hx     Social History:   reports that she has never smoked. She has never used smokeless tobacco. She reports that she does not drink alcohol and does not use drugs.  Physical Exam: BP 106/86   Pulse (!) 114   Ht 5\' 5"  (1.651 m)   Wt 155 lb (70.3 kg)   LMP 05/06/2020   BMI 25.79 kg/m   Constitutional:  Alert and oriented, no acute distress, nontoxic appearing HEENT: [place], AT Cardiovascular: No clubbing, cyanosis, or edema Respiratory: Normal respiratory effort, no increased work of breathing GU: No CVA tenderness Skin: No rashes, bruises or suspicious lesions Neurologic: Grossly intact, no focal deficits, moving all 4 extremities Psychiatric: Normal mood  and affect  Laboratory Data: Results for orders placed or performed in visit on 05/09/20  Microscopic Examination   Urine  Result Value Ref Range   WBC, UA 11-30 (A) 0 - 5 /hpf   RBC 11-30 (A) 0 - 2 /hpf   Epithelial Cells (non renal) 0-10 0 - 10 /hpf   Bacteria, UA Few None  seen/Few  Urinalysis, Complete  Result Value Ref Range   Specific Gravity, UA 1.010 1.005 - 1.030   pH, UA 7.0 5.0 - 7.5   Color, UA Straw Yellow   Appearance Ur Cloudy (A) Clear   Leukocytes,UA 1+ (A) Negative   Protein,UA Negative Negative/Trace   Glucose, UA Negative Negative   Ketones, UA Negative Negative   RBC, UA 3+ (A) Negative   Bilirubin, UA Negative Negative   Urobilinogen, Ur 0.2 0.2 - 1.0 mg/dL   Nitrite, UA Negative Negative   Microscopic Examination See below:    Pertinent Imaging: KUB, 05/09/2020: CLINICAL DATA:  Flank pain, nephrolithiasis  EXAM: ABDOMEN - 1 VIEW  COMPARISON:  08/14/2019  FINDINGS: The bowel gas pattern is normal. Unchanged appearance of nonobstructive calculi projecting in the expected location of the inferior pole of the right kidney. No evident left-sided or ureteral calculi. Nonobstructive pattern of bowel gas.  IMPRESSION: Unchanged appearance of nonobstructive calculi projecting in the expected location of the inferior pole of the right kidney. No evident left-sided or ureteral calculi. CT is the test of choice for the evaluation of urinary tract calculi.   Electronically Signed   By: Lauralyn Primes M.D.   On: 05/10/2020 14:26  I personally reviewed the images referenced above and note stable right PCNL tract calcifications.  Assessment & Plan:   1. Dysuria Worsened dysuria, bilateral flank pain, chills, headache, nausea and UA with microscopic hematuria and pyuria consistent with acute cystitis versus early pyelonephritis. KUB reassuring for acute stone episode. Will send for culture and start empiric Bactrim. Counseled the patient to contact our office if she does not begin to have symptomatic improvement within 72 hours. If she does not improve as expected, recommend CT AP without contrast for further evaluation of her symptoms.  Reviewed return precautions including fever, worsened pain, and vomiting. Counseled her to  proceed to the emergency department if she develops any of these. She expressed understanding.  For UTI prevention, counseled patient to start Benefiber versus MiraLAX for management of intermittent constipation. - Urinalysis, Complete - CULTURE, URINE COMPREHENSIVE - sulfamethoxazole-trimethoprim (BACTRIM DS) 800-160 MG tablet; Take 1 tablet by mouth 2 (two) times daily for 7 days.  Dispense: 14 tablet; Refill: 0   Return in about 1 week (around 05/16/2020) for Lab visit for UA.  Carman Ching, PA-C  [facility] [address] [place], [place] [address] [phone]

## 2020-05-10 ENCOUNTER — Other Ambulatory Visit: Payer: Self-pay

## 2020-05-10 LAB — MICROSCOPIC EXAMINATION

## 2020-05-10 LAB — URINALYSIS, COMPLETE
Bilirubin, UA: NEGATIVE
Glucose, UA: NEGATIVE
Ketones, UA: NEGATIVE
Nitrite, UA: NEGATIVE
Protein,UA: NEGATIVE
Specific Gravity, UA: 1.01 (ref 1.005–1.030)
Urobilinogen, Ur: 0.2 mg/dL (ref 0.2–1.0)
pH, UA: 7 (ref 5.0–7.5)

## 2020-05-12 LAB — CULTURE, URINE COMPREHENSIVE

## 2020-05-16 ENCOUNTER — Other Ambulatory Visit: Payer: Self-pay

## 2020-05-17 ENCOUNTER — Encounter: Payer: Self-pay | Admitting: Urology

## 2020-05-19 ENCOUNTER — Other Ambulatory Visit: Payer: Self-pay

## 2020-05-19 ENCOUNTER — Other Ambulatory Visit: Payer: Self-pay | Admitting: Urology

## 2020-05-19 DIAGNOSIS — R3129 Other microscopic hematuria: Secondary | ICD-10-CM

## 2020-05-19 NOTE — Progress Notes (Signed)
Orders in for ua and urine culture

## 2020-05-19 NOTE — Addendum Note (Signed)
Addended by: Milas Kocher A on: 05/19/2020 04:09 PM   Modules accepted: Orders

## 2020-05-20 LAB — URINALYSIS, COMPLETE
Bilirubin, UA: NEGATIVE
Glucose, UA: NEGATIVE
Ketones, UA: NEGATIVE
Leukocytes,UA: NEGATIVE
Nitrite, UA: NEGATIVE
Protein,UA: NEGATIVE
Specific Gravity, UA: 1.015 (ref 1.005–1.030)
Urobilinogen, Ur: 0.2 mg/dL (ref 0.2–1.0)
pH, UA: 5.5 (ref 5.0–7.5)

## 2020-05-20 LAB — MICROSCOPIC EXAMINATION: Epithelial Cells (non renal): >10 /hpf — AB (ref 0–10)

## 2020-05-25 LAB — CULTURE, URINE COMPREHENSIVE

## 2020-05-26 ENCOUNTER — Telehealth: Payer: Self-pay | Admitting: Physician Assistant

## 2020-05-26 NOTE — Telephone Encounter (Signed)
Please contact the patient and ask if she is having any persistent symptoms of infection including flank pain, lower abdominal pain, low back pain, dysuria, urgency, frequency, nausea, vomiting, fever, or chills.  If she is having any of these, I would like her to return to clinic for a cath UA to reevaluate her urine.  The sample she provided last week was contaminated, so I am unable to assess it reliably.  If she is not having any of the symptoms, we can defer further evaluation for now.

## 2020-05-26 NOTE — Telephone Encounter (Signed)
Contacted patient as advised via language line using a spanish interpreter. Patient denies any infective symptoms at this time and no appointment was made.

## 2020-08-24 ENCOUNTER — Ambulatory Visit: Payer: Self-pay | Admitting: Urology

## 2020-09-30 ENCOUNTER — Ambulatory Visit
Admission: RE | Admit: 2020-09-30 | Discharge: 2020-09-30 | Disposition: A | Discharge status: Home / Self Care | Payer: Self-pay | Source: Ambulatory Visit | Attending: Urology | Admitting: Urology

## 2020-10-03 ENCOUNTER — Other Ambulatory Visit: Payer: Self-pay

## 2020-10-03 DIAGNOSIS — N200 Calculus of kidney: Secondary | ICD-10-CM

## 2020-10-04 ENCOUNTER — Ambulatory Visit: Payer: Self-pay | Admitting: Urology

## 2020-10-04 ENCOUNTER — Ambulatory Visit
Admission: RE | Admit: 2020-10-04 | Discharge: 2020-10-04 | Disposition: A | Discharge status: Home / Self Care | Payer: Self-pay | Source: Ambulatory Visit | Attending: Urology | Admitting: Urology

## 2020-10-04 ENCOUNTER — Other Ambulatory Visit: Payer: Self-pay

## 2020-10-04 VITALS — BP 130/80 | HR 70 | Ht 62.0 in | Wt 158.0 lb

## 2020-10-04 DIAGNOSIS — R3915 Urgency of urination: Secondary | ICD-10-CM

## 2020-10-04 DIAGNOSIS — N200 Calculus of kidney: Secondary | ICD-10-CM | POA: Insufficient documentation

## 2020-10-04 DIAGNOSIS — K5909 Other constipation: Secondary | ICD-10-CM

## 2020-10-04 DIAGNOSIS — R109 Unspecified abdominal pain: Secondary | ICD-10-CM

## 2020-10-04 DIAGNOSIS — N390 Urinary tract infection, site not specified: Secondary | ICD-10-CM

## 2020-10-04 DIAGNOSIS — Z87442 Personal history of urinary calculi: Secondary | ICD-10-CM

## 2020-10-04 NOTE — Progress Notes (Signed)
10/04/2020 10:14 AM   Jasmine Henderson [birth year] [id number]  Referring provider: [facility] [address] [place],  [place] [address]  Chief Complaint  Patient presents with  . Nephrolithiasis    HPI: 45 year old female with a personal history of recurrent UTIs and staghorn calculus returns today for routine annual follow-up.  She is accompanied today by a Engineer, structural.  Most recent KUB on 04/2020 shows some stable calcification in the right lower pole of the kidney, presumably in the PCNL tract.  She does have some chronic right flank pain since the time of her PCNL.  This is not severe, intermittent and dull.  Is unchanged times many years.  She does mention today that she has some vaginal irritation.  She denies any discharge or odor.  No pain with intercourse.  She does have some baseline urinary frequency, urgency with occasional urge incontinence.  This is somewhat bothersome to her.  She does suffer from chronic constipation.  She is not doing anything to help with this.  She primarily drinks water.  She was treated in December 2021 for an E. coli UTI.  She also had a Proteus UTI in March 2021 as well as November 2021.  She continues to have normal menstrual cycles.  PMH: Past Medical History:  Diagnosis Date  . History of kidney stones   . Hypothyroidism   . Pre-diabetes   . Seizures (HCC)    pt states she had dizzy spell, passed out and was seizing. unknown reason. NO treatment. neurologist could not find anything  . Thyroid disease     Surgical History: Past Surgical History:  Procedure Laterality Date  . CYSTOSCOPY/URETEROSCOPY/HOLMIUM LASER/STENT PLACEMENT Left 09/24/2017   Procedure: CYSTOSCOPY/URETEROSCOPY/left retrorade pyleogram;  Surgeon: Riki Altes, MD;  Location: [facility];  Service: Urology;  Laterality: Left;  . CYSTOSCOPY/URETEROSCOPY/HOLMIUM LASER/STENT PLACEMENT Right 12/18/2017    Procedure: CYSTOSCOPY/URETEROSCOPY/HOLMIUM LASER/STENT Exchange;  Surgeon: Vanna Scotland, MD;  Location: [facility];  Service: Urology;  Laterality: Right;  . IR NEPHROSTOMY PLACEMENT RIGHT  11/25/2017  . NEPHROLITHOTOMY Right 11/25/2017   Procedure: NEPHROLITHOTOMY PERCUTANEOUS;  Surgeon: Vanna Scotland, MD;  Location: [facility];  Service: Urology;  Laterality: Right;    Home Medications:  Allergies as of 10/04/2020   No Known Allergies     Medication List       Accurate as of October 04, 2020 10:14 AM. If you have any questions, ask your nurse or doctor.        levothyroxine 100 MCG tablet Commonly known as: SYNTHROID Take 100 mcg by mouth daily before breakfast.       Allergies: No Known Allergies  Family History: Family History  Problem Relation Age of Onset  . Bladder Cancer Neg Hx   . Kidney cancer Neg Hx     Social History:  reports that she has never smoked. She has never used smokeless tobacco. She reports that she does not drink alcohol and does not use drugs.   Physical Exam: BP 130/80   Pulse 70   Ht 5\' 2"  (1.575 m)   Wt 158 lb (71.7 kg)   LMP 09/27/2020   BMI 28.90 kg/m   Constitutional:  Alert and oriented, No acute distress. HEENT: [place] AT, moist mucus membranes.  Trachea midline, no masses. Cardiovascular: No clubbing, cyanosis, or edema. Respiratory: Normal respiratory effort, no increased work of breathing. Skin: No rashes, bruises or suspicious lesions. Neurologic: Grossly intact, no focal deficits, moving all 4 extremities. Psychiatric: Normal  mood and affect.  Urinalysis Urinalysis today with 11-30 white blood cells, 3-10 red blood cells, greater than 10 epithelial cells, moderate bacteria, nitrite negative.  Pertinent Imaging: KUB today was personally reviewed.  Stable calcification over previous right nephrostomy tube tract, no new stone burden.  Assessment & Plan:    1. Right nephrolithiasis Stable unchanged calcification within right  nephrostomy tract from PCNL, no new stone burden which is reassuring - Urinalysis, Complete - CULTURE, URINE COMPREHENSIVE  2. Chronic constipation Complains today of urinary urgency with occasional urge incontinence  Discussed how this may be related to underlying poorly controlled constipation.  She was advised to increase water, try stool softener, addition of MiraLAX with a goal of achieving 1 soft well formed daily bowel movement.   3. Recurrent UTI Personal history of recurrent UTIs although less than 3 infections per year  We will hold off on prophylaxis at this time given fairly infrequent infections  Urinalysis today appears to be contaminated with multiple epithelial cells, given vaginal irritation, suspect small mount of blood and leukocytes related to this.  We will send off urine culture to ensure.  Note is specific urinary symptoms today other than frequency/urgency.  4. Flank pain with history of urolithiasis Chronic intermittent right flank pain status post PCNL, unchanged  5. Urinary urgency The discussion today about treating her chronic constipation first, this may improve her urinary symptoms  If her urinary symptoms fail to improve when she is on a regular bowel regimen, may consider the addition of anticholinergic to treat her OAB although this may exacerbate her constipation.  She will let us know how it goes.   Return in about 1 year (around 10/04/2021). or sooner as needed  Vanna Scotland, MD  [facility] [address] [place], [place] [address] [phone]

## 2020-10-04 NOTE — Patient Instructions (Signed)
Estreimiento, en adultos Constipation, Adult El estreimiento se produce cuando una persona tiene problemas para defecar (hacer sus deposiciones). Cuando tiene esta afeccin, el nmero de deposiciones es inferior a 3 veces por semana. Las deposiciones (heces) tambin pueden ser secas, duras o ms voluminosas de lo normal. Siga estas instrucciones en su casa: Comida y bebida  Consuma alimentos con alto contenido de fibra, por ejemplo: ? Frutas y verduras frescas. ? Cereales integrales. ? Frijoles.  Consuma menos alimentos bajos en fibra y con alto contenido de grasas y azcar, como: ? Papas fritas. ? Hamburguesas. ? Galletas dulces. ? Caramelos. ? Gaseosas.  Beba suficiente lquido para mantener el pis (orina) de color amarillo plido.   Instrucciones generales  Haga actividad fsica con regularidad o segn las indicaciones del mdico. Intente practicar 150minutos de actividad fsica por semana.  Vaya al bao cuando sienta la necesidad de defecar. No se aguante las ganas.  Use los medicamentos de venta libre y los recetados solamente como se lo haya indicado el mdico. Estos incluyen los suplementos de fibra.  Cuando est defecando: ? Respire profundamente mientras relaja la parte inferior del vientre (abdomen). ? Relaje el suelo plvico. El suelo plvico est formado por un grupo de msculos que sostienen el recto, la vejiga y los intestinos (as como el tero en las mujeres).  Controle su afeccin para detectar cualquier cambio. Visite al mdico si advierte lo indicado.  Concurra a todas las visitas de seguimiento como se lo haya indicado el mdico. Esto es importante. Comunquese con un mdico si:  Su dolor empeora.  Tiene fiebre.  No ha defecado por 4das.  Vomita.  No tiene hambre.  Pierde peso.  Tiene sangrado por la abertura entre las nalgas (ano).  Las deposiciones son delgadas como un lpiz. Solicite ayuda de inmediato si:  Tiene fiebre, y los sntomas  empeoran de repente.  Tiene prdida de materia fecal u observa sangre en las heces.  Siente el vientre ms duro o ms grande de lo normal (hinchado).  Siente un dolor muy intenso en el vientre.  Se siente mareado o se desmaya. Resumen  El estreimiento se produce cuando una persona defeca menos de 3 veces a la semana, tiene problemas para defecar o las heces son secas, duras o ms grandes que lo normal.  Consuma alimentos con un alto contenido de fibra.  Beba suficiente lquido para mantener el pis (orina) de color amarillo plido.  Use los medicamentos de venta libre y los recetados solamente como se lo haya indicado el mdico. Estos incluyen los suplementos de fibra. Esta informacin no tiene como fin reemplazar el consejo del mdico. Asegrese de hacerle al mdico cualquier pregunta que tenga. Document Revised: 07/10/2019 Document Reviewed: 07/10/2019 Elsevier Patient Education  2021 Elsevier Inc.  

## 2020-10-06 LAB — URINALYSIS, COMPLETE
Bilirubin, UA: NEGATIVE
Glucose, UA: NEGATIVE
Ketones, UA: NEGATIVE
Nitrite, UA: NEGATIVE
Protein,UA: NEGATIVE
Specific Gravity, UA: 1.015 (ref 1.005–1.030)
Urobilinogen, Ur: 0.2 mg/dL (ref 0.2–1.0)
pH, UA: 7 (ref 5.0–7.5)

## 2020-10-06 LAB — MICROSCOPIC EXAMINATION: Epithelial Cells (non renal): >10 /hpf — AB (ref 0–10)

## 2020-10-08 LAB — CULTURE, URINE COMPREHENSIVE

## 2020-10-10 ENCOUNTER — Telehealth: Payer: Self-pay | Admitting: *Deleted

## 2020-10-10 MED ORDER — SULFAMETHOXAZOLE-TRIMETHOPRIM 800-160 MG PO TABS: 1.0000 | ORAL_TABLET | Freq: Two times a day (BID) | ORAL | 0 refills | Status: DC

## 2020-10-10 NOTE — Telephone Encounter (Addendum)
  Interpreter CB#[id number] informed patient-she reports dysuria and frequency. Denies any other symptoms. Sent Bactrim to [facility] as requested.  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  If having UTI symptoms, treat with bactrim ds bid x 5 days  Vanna Scotland, MD

## 2021-03-23 ENCOUNTER — Other Ambulatory Visit: Payer: Self-pay

## 2021-03-23 DIAGNOSIS — N200 Calculus of kidney: Secondary | ICD-10-CM

## 2021-03-28 ENCOUNTER — Other Ambulatory Visit: Payer: Self-pay

## 2021-03-28 ENCOUNTER — Telehealth: Payer: Self-pay

## 2021-03-28 DIAGNOSIS — N200 Calculus of kidney: Secondary | ICD-10-CM

## 2021-03-28 LAB — CALCULI, WITH PHOTOGRAPH (CLINICAL LAB)
Ammonium Acid Urate Calculi: 30 %
Carbonate Apatite: 10 %
Mg NH4 PO4 (Struvite): 60 %
Weight Calculi: 4 mg

## 2021-03-28 NOTE — Telephone Encounter (Signed)
Using interpreter services, interpreter # [phone], notified patient as advised. Placed order for KUB and advised patient we would follow up with her after. She expressed understanding.

## 2021-03-28 NOTE — Telephone Encounter (Signed)
-----   Message from Carman Ching, [place] sent at 03/28/2021  3:42 PM EDT ----- Her stone was primarily struvite and she has a history of struvite stones, which can grow rather quickly. Let's get a KUB to assess her for interval stone growth; we can call her with her results. ----- Message ----- From: [facility] Lab Results In Sent: 03/28/2021  11:36 AM EDT To: Carman Ching, PA-C

## 2021-03-29 ENCOUNTER — Ambulatory Visit
Admission: RE | Admit: 2021-03-29 | Discharge: 2021-03-29 | Disposition: A | Discharge status: Home / Self Care | Payer: 59 | Source: Ambulatory Visit | Attending: Physician Assistant | Admitting: Physician Assistant

## 2021-03-29 ENCOUNTER — Ambulatory Visit
Admission: RE | Admit: 2021-03-29 | Discharge: 2021-03-29 | Disposition: A | Discharge status: Home / Self Care | Payer: 59 | Attending: Physician Assistant | Admitting: Physician Assistant

## 2021-03-29 DIAGNOSIS — N200 Calculus of kidney: Secondary | ICD-10-CM | POA: Diagnosis present

## 2021-04-10 ENCOUNTER — Telehealth: Payer: Self-pay

## 2021-04-10 ENCOUNTER — Telehealth: Payer: Self-pay | Admitting: *Deleted

## 2021-04-10 NOTE — Telephone Encounter (Signed)
Ok per [employer], notified patients daughter of message below. Confirmed patients next appt. Daughter expressed understanding.

## 2021-04-10 NOTE — Telephone Encounter (Signed)
-----   Message from [place], [place] sent at 04/10/2021  9:33 AM EDT ----- KUB shows slight interval enlargement of her stone on the right side, however I discussed this with Dr. Apolinar Junes and we do not feel anything needs to be done about this at this time. She should keep follow-up as scheduled. ----- Message ----- From: Interface, Rad Results In Sent: 04/01/2021   1:25 PM EDT To: Carman Ching, PA-C

## 2021-04-10 NOTE — Telephone Encounter (Signed)
Call daughter with results per patient. [phone]  xray done on 03/29/2021

## 2021-04-10 NOTE — Telephone Encounter (Signed)
See separate note.

## 2021-05-19 ENCOUNTER — Other Ambulatory Visit: Payer: Self-pay

## 2021-05-19 ENCOUNTER — Other Ambulatory Visit: Payer: Self-pay | Admitting: Physician Assistant

## 2021-05-19 ENCOUNTER — Ambulatory Visit (INDEPENDENT_AMBULATORY_CARE_PROVIDER_SITE_OTHER): Payer: 59 | Admitting: Physician Assistant

## 2021-05-19 ENCOUNTER — Encounter: Payer: Self-pay | Admitting: Physician Assistant

## 2021-05-19 VITALS — BP 137/90 | HR 79 | Ht 62.0 in | Wt 158.0 lb

## 2021-05-19 DIAGNOSIS — N9489 Other specified conditions associated with female genital organs and menstrual cycle: Secondary | ICD-10-CM | POA: Diagnosis not present

## 2021-05-19 DIAGNOSIS — Z87442 Personal history of urinary calculi: Secondary | ICD-10-CM

## 2021-05-19 DIAGNOSIS — R109 Unspecified abdominal pain: Secondary | ICD-10-CM | POA: Diagnosis not present

## 2021-05-19 DIAGNOSIS — N200 Calculus of kidney: Secondary | ICD-10-CM

## 2021-05-19 LAB — MICROSCOPIC EXAMINATION: RBC, Urine: NONE SEEN /hpf (ref 0–2)

## 2021-05-19 LAB — URINALYSIS, COMPLETE
Bilirubin, UA: NEGATIVE
Glucose, UA: NEGATIVE
Ketones, UA: NEGATIVE
Nitrite, UA: NEGATIVE
Protein,UA: NEGATIVE
Specific Gravity, UA: 1.01 (ref 1.005–1.030)
Urobilinogen, Ur: 0.2 mg/dL (ref 0.2–1.0)
pH, UA: 7.5 (ref 5.0–7.5)

## 2021-05-19 NOTE — H&P (View-Only) (Signed)
05/19/2021 3:46 PM   Allyne Gee 1975-07-16 [id number]  CC: Chief Complaint  Patient presents with   Dysuria   HPI: Jasmine Henderson is a 45 y.o. female with PMH recurrent UTI, nephrolithiasis including full right staghorn calculus who underwent PCNL with Dr. Erlene Quan in 2019, left ureterocele, and chronic left hydronephrosis who presents today for evaluation of possible UTI.  Visit today assisted by medical interpreter.  Today she reports an approximate 5-day history of intermittent vulvar burning that is not associated with urination.  She has also noticed some increased white discharge and itching.  She denies gross hematuria, fever, chills, nausea, vomiting, and malodorous urine or vaginal odor.  She also reports stable, months long history of intermittent right flank pain that is worse when laying on her right side.  Per chart review, this appears to be chronic.  She underwent KUB after spontaneously passing a stone on 03/29/2021, which revealed some interval enlargement of a right lower pole stone now extending into the infundibulum.  In-office catheterized UA today positive for 1+ blood and 1+ leukocyte esterase; urine microscopy with 6-10 WBCs/HPF.  PMH: Past Medical History:  Diagnosis Date   History of kidney stones    Hypothyroidism    Pre-diabetes    Seizures ([place])    pt states she had dizzy spell, passed out and was seizing. unknown reason. NO treatment. neurologist could not find anything   Thyroid disease     Surgical History: Past Surgical History:  Procedure Laterality Date   CYSTOSCOPY/URETEROSCOPY/HOLMIUM LASER/STENT PLACEMENT Left 09/24/2017   Procedure: CYSTOSCOPY/URETEROSCOPY/left retrorade pyleogram;  Surgeon: Abbie Sons, MD;  Location: [facility];  Service: Urology;  Laterality: Left;   CYSTOSCOPY/URETEROSCOPY/HOLMIUM LASER/STENT PLACEMENT Right 12/18/2017   Procedure: CYSTOSCOPY/URETEROSCOPY/HOLMIUM LASER/STENT Exchange;  Surgeon:  Hollice Espy, MD;  Location: [facility];  Service: Urology;  Laterality: Right;   IR NEPHROSTOMY PLACEMENT RIGHT  11/25/2017   NEPHROLITHOTOMY Right 11/25/2017   Procedure: NEPHROLITHOTOMY PERCUTANEOUS;  Surgeon: Hollice Espy, MD;  Location: [facility];  Service: Urology;  Laterality: Right;    Home Medications:  Allergies as of 05/19/2021   No Known Allergies      Medication List        Accurate as of May 19, 2021  3:46 PM. If you have any questions, ask your nurse or doctor.          STOP taking these medications    sulfamethoxazole-trimethoprim 800-160 MG tablet Commonly known as: BACTRIM DS Stopped by: Debroah Loop, PA-C       TAKE these medications    levothyroxine 100 MCG tablet Commonly known as: SYNTHROID Take 100 mcg by mouth daily before breakfast.        Allergies:  No Known Allergies  Family History: Family History  Problem Relation Age of Onset   Bladder Cancer Neg Hx    Kidney cancer Neg Hx     Social History:   reports that she has never smoked. She has never used smokeless tobacco. She reports that she does not drink alcohol and does not use drugs.  Physical Exam: BP 137/90    Pulse 79    Ht _0  (1.575 m)    Wt 158 lb (71.7 kg)    BMI 28.90 kg/m   Constitutional:  Alert and oriented, no acute distress, nontoxic appearing HEENT: [place], AT Cardiovascular: No clubbing, cyanosis, or edema Respiratory: Normal respiratory effort, no increased work of breathing Skin: No rashes, bruises or suspicious lesions Neurologic: Grossly intact, no focal deficits, moving all  4 extremities Psychiatric: Normal mood and affect  Laboratory Data: Results for orders placed or performed in visit on 05/19/21  Microscopic Examination   Urine  Result Value Ref Range   WBC, UA 6-10 (A) 0 - 5 /hpf   RBC None seen 0 - 2 /hpf   Epithelial Cells (non renal) 0-10 0 - 10 /hpf   Bacteria, UA Few (A) None seen/Few  Urinalysis, Complete  Result Value  Ref Range   Specific Gravity, UA 1.010 1.005 - 1.030   pH, UA 7.5 5.0 - 7.5   Color, UA Yellow Yellow   Appearance Ur Clear Clear   Leukocytes,UA 1+ (A) Negative   Protein,UA Negative Negative/Trace   Glucose, UA Negative Negative   Ketones, UA Negative Negative   RBC, UA 1+ (A) Negative   Bilirubin, UA Negative Negative   Urobilinogen, Ur 0.2 0.2 - 1.0 mg/dL   Nitrite, UA Negative Negative   Microscopic Examination See below:    Assessment & Plan:   1. Vulvar burning Not associated with urination.  Her UA is rather bland today.  I have low concern for UTI, but will send for culture and treat as indicated.  I explained that I suspect her symptoms are more consistent with vulvovaginal candidiasis and I encouraged her to pick up an over-the-counter treatment kit for this.  She expressed understanding. - Urinalysis, Complete - CULTURE, URINE COMPREHENSIVE  2. Flank pain with history of urolithiasis Chronic, unchanged, no indication for further imaging at this time.  I offered her a right ureteroscopy for management of her enlarging right lower pole stone on a nonurgent basis based on her history of recurrent stone disease and staghorn stone.  I explained to her that treating the stone would not necessarily make an impact on her recurrent UTIs or chronic right flank pain.  Patient expressed understanding and she wishes to proceed.  Return for Will call to schedule surgery.  Debroah Loop, PA-C  [facility] [address] [place], [place] [address] [phone]

## 2021-05-19 NOTE — Progress Notes (Signed)
° °05/19/2021 °3:46 PM  ° °Jasmine Henderson °03/23/1976 °[id number] ° °CC: °Chief Complaint  °Patient presents with  ° Dysuria  ° °HPI: °Jasmine Henderson is a 45 y.o. female with PMH recurrent UTI, nephrolithiasis including full right staghorn calculus who underwent PCNL with Dr. Brandon in 2019, left ureterocele, and chronic left hydronephrosis who presents today for evaluation of possible UTI.  Visit today assisted by medical interpreter. ° °Today she reports an approximate 5-day history of intermittent vulvar burning that is not associated with urination.  She has also noticed some increased white discharge and itching.  She denies gross hematuria, fever, chills, nausea, vomiting, and malodorous urine or vaginal odor. ° °She also reports stable, months long history of intermittent right flank pain that is worse when laying on her right side.  Per chart review, this appears to be chronic. ° °She underwent KUB after spontaneously passing a stone on 03/29/2021, which revealed some interval enlargement of a right lower pole stone now extending into the infundibulum. ° °In-office catheterized UA today positive for 1+ blood and 1+ leukocyte esterase; urine microscopy with 6-10 WBCs/HPF. ° °PMH: °Past Medical History:  °Diagnosis Date  ° History of kidney stones   ° Hypothyroidism   ° Pre-diabetes   ° Seizures (HCC)   ° pt states she had dizzy spell, passed out and was seizing. unknown reason. NO treatment. neurologist could not find anything  ° Thyroid disease   ° ° °Surgical History: °Past Surgical History:  °Procedure Laterality Date  ° CYSTOSCOPY/URETEROSCOPY/HOLMIUM LASER/STENT PLACEMENT Left 09/24/2017  ° Procedure: CYSTOSCOPY/URETEROSCOPY/left retrorade pyleogram;  Surgeon: Stoioff, Scott C, MD;  Location: [facility];  Service: Urology;  Laterality: Left;  ° CYSTOSCOPY/URETEROSCOPY/HOLMIUM LASER/STENT PLACEMENT Right 12/18/2017  ° Procedure: CYSTOSCOPY/URETEROSCOPY/HOLMIUM LASER/STENT Exchange;  Surgeon:  Brandon, Ashley, MD;  Location: [facility];  Service: Urology;  Laterality: Right;  ° IR NEPHROSTOMY PLACEMENT RIGHT  11/25/2017  ° NEPHROLITHOTOMY Right 11/25/2017  ° Procedure: NEPHROLITHOTOMY PERCUTANEOUS;  Surgeon: Brandon, Ashley, MD;  Location: [facility];  Service: Urology;  Laterality: Right;  ° ° °Home Medications:  °Allergies as of 05/19/2021   °No Known Allergies °  ° °  °Medication List  °  ° °  ° Accurate as of May 19, 2021  3:46 PM. If you have any questions, ask your nurse or doctor.  °  °  ° °  ° °STOP taking these medications   ° °sulfamethoxazole-trimethoprim 800-160 MG tablet °Commonly known as: BACTRIM DS °Stopped by: Rainee Sweatt, PA-C °  ° °  ° °TAKE these medications   ° °levothyroxine 100 MCG tablet °Commonly known as: SYNTHROID °Take 100 mcg by mouth daily before breakfast. °  ° °  ° ° °Allergies:  °No Known Allergies ° °Family History: °Family History  °Problem Relation Age of Onset  ° Bladder Cancer Neg Hx   ° Kidney cancer Neg Hx   ° ° °Social History:  ° reports that she has never smoked. She has never used smokeless tobacco. She reports that she does not drink alcohol and does not use drugs. ° °Physical Exam: °BP 137/90    Pulse 79    Ht 5' 2" (1.575 m)    Wt 158 lb (71.7 kg)    BMI 28.90 kg/m²   °Constitutional:  Alert and oriented, no acute distress, nontoxic appearing °HEENT: [place], AT °Cardiovascular: No clubbing, cyanosis, or edema °Respiratory: Normal respiratory effort, no increased work of breathing °Skin: No rashes, bruises or suspicious lesions °Neurologic: Grossly intact, no focal deficits, moving all   4 extremities °Psychiatric: Normal mood and affect ° °Laboratory Data: °Results for orders placed or performed in visit on 05/19/21  °Microscopic Examination  ° Urine  °Result Value Ref Range  ° WBC, UA 6-10 (A) 0 - 5 /hpf  ° RBC None seen 0 - 2 /hpf  ° Epithelial Cells (non renal) 0-10 0 - 10 /hpf  ° Bacteria, UA Few (A) None seen/Few  °Urinalysis, Complete  °Result Value  Ref Range  ° Specific Gravity, UA 1.010 1.005 - 1.030  ° pH, UA 7.5 5.0 - 7.5  ° Color, UA Yellow Yellow  ° Appearance Ur Clear Clear  ° Leukocytes,UA 1+ (A) Negative  ° Protein,UA Negative Negative/Trace  ° Glucose, UA Negative Negative  ° Ketones, UA Negative Negative  ° RBC, UA 1+ (A) Negative  ° Bilirubin, UA Negative Negative  ° Urobilinogen, Ur 0.2 0.2 - 1.0 mg/dL  ° Nitrite, UA Negative Negative  ° Microscopic Examination See below:   ° °Assessment & Plan:   °1. Vulvar burning °Not associated with urination.  Her UA is rather bland today.  I have low concern for UTI, but will send for culture and treat as indicated.  I explained that I suspect her symptoms are more consistent with vulvovaginal candidiasis and I encouraged her to pick up an over-the-counter treatment kit for this.  She expressed understanding. °- Urinalysis, Complete °- CULTURE, URINE COMPREHENSIVE ° °2. Flank pain with history of urolithiasis °Chronic, unchanged, no indication for further imaging at this time.  I offered her a right ureteroscopy for management of her enlarging right lower pole stone on a nonurgent basis based on her history of recurrent stone disease and staghorn stone.  I explained to her that treating the stone would not necessarily make an impact on her recurrent UTIs or chronic right flank pain.  Patient expressed understanding and she wishes to proceed. ° °Return for Will call to schedule surgery. ° °Mylisa Brunson, PA-C ° °[facility] °[address], [address] °[place], [place] [address] °[phone] °   °

## 2021-05-19 NOTE — Progress Notes (Signed)
Surgical Physician Order Form [facility] [place]  * Dr. Apolinar Junes Scheduling expectation : Next Available  *Length of Case:   *Clearance needed: no  *Anticoagulation Instructions: N/A  *Aspirin Instructions: N/A  *Post-op visit Date/Instructions:  1 month with RUS prior  *Diagnosis: Right Nephrolithiasis  *Procedure: right Ureteroscopy w/laser lithotripsy & stent placement (33435)   Additional orders: N/A  -Admit type: OUTpatient  -Anesthesia: General  -VTE Prophylaxis Standing Order SCD's       Other:   -Standing Lab Orders Per Anesthesia    Lab other: UA&Urine Culture  -Standing Test orders EKG/Chest x-ray per Anesthesia       Test other:   - Medications:  Ancef 1gm IV  -Other orders:  N/A

## 2021-05-26 LAB — CULTURE, URINE COMPREHENSIVE

## 2021-05-26 NOTE — Progress Notes (Signed)
[facility] Posting Form   Surgery Date/Time: Date: 06/08/2021  Surgeon: Dr. Vanna Scotland, MD   Surgery Location: Day Surgery  Inpt ( No  )   Outpt (Yes)   Obs ( No  )   Diagnosis: Right Nephrolithiasis N20.0  -CPT: 52356  Surgery: Right Ureteroscopy with laser lithotripsy and stent placement  Stop Anticoagulations: No  Cardiac/Medical/Pulmonary Clearance needed: No  *Orders entered into EPIC  Date: 05/26/21   *Case booked in EPIC  Date: 05/26/21  *Notified pt of Surgery: Date: 05/26/21  PRE-OP UA & CX: Yes, obtained on 05/19/2021  *Placed into Prior Authorization Work Angela Nevin Date: 05/26/21   Assistant/laser/rep:No

## 2021-05-30 ENCOUNTER — Telehealth: Payer: Self-pay

## 2021-05-30 DIAGNOSIS — N200 Calculus of kidney: Secondary | ICD-10-CM

## 2021-05-30 MED ORDER — SULFAMETHOXAZOLE-TRIMETHOPRIM 800-160 MG PO TABS: 1.0000 | ORAL_TABLET | Freq: Two times a day (BID) | ORAL | 0 refills | Status: AC

## 2021-05-30 NOTE — Telephone Encounter (Signed)
-----   Message from Carman Ching, [place] sent at 05/30/2021  3:57 PM EST ----- Bactrim DS BID x 5 days ----- Message ----- From: Interface, [facility] Lab Results In Sent: 05/19/2021  11:36 AM EST To: Carman Ching, PA-C

## 2021-05-30 NOTE — Telephone Encounter (Signed)
OK per DPR, LMOM for daughter Kandis Cocking (listed on DPR) informing her of message below. Abx sent to [facility]. Advised daughter to return call if she has any questions.

## 2021-06-01 ENCOUNTER — Encounter
Admission: RE | Admit: 2021-06-01 | Discharge: 2021-06-01 | Disposition: A | Discharge status: Home / Self Care | Payer: 59 | Source: Ambulatory Visit | Attending: Urology | Admitting: Urology

## 2021-06-01 ENCOUNTER — Other Ambulatory Visit: Payer: Self-pay

## 2021-06-01 NOTE — Patient Instructions (Signed)
Your procedure is scheduled on: 06/08/21 Report to DAY SURGERY DEPARTMENT LOCATED ON 2ND FLOOR MEDICAL MALL ENTRANCE. To find out your arrival time please call [phone] between 1PM - 3PM on 06/07/21.  Remember: Instructions that are not followed completely may result in serious medical risk, up to and including death, or upon the discretion of your surgeon and anesthesiologist your surgery may need to be rescheduled.     _X__ 1. Do not eat food or drink any liquids after midnight the night before your procedure.                 No gum chewing or hard candies.   __X__2.  On the morning of surgery brush your teeth with toothpaste and water, you                 may rinse your mouth with mouthwash if you wish.  Do not swallow any              toothpaste of mouthwash.     _X__ 3.  No Alcohol for 24 hours before or after surgery.   _X__ 4.  Do Not Smoke or use e-cigarettes For 24 Hours Prior to Your Surgery.                 Do not use any chewable tobacco products for at least 6 hours prior to                 surgery.  ____  5.  Bring all medications with you on the day of surgery if instructed.   __X__  6.  Notify your doctor if there is any change in your medical condition      (cold, fever, infections).     Do not wear jewelry, make-up, hairpins, clips or nail polish. Do not wear lotions, powders, or perfumes.  Do not shave body hair 48 hours prior to surgery. Men may shave face and neck. Do not bring valuables to the hospital.    [facility] is not responsible for any belongings or valuables.  Contacts, dentures/partials or body piercings may not be worn into surgery. Bring a case for your contacts, glasses or hearing aids, a denture cup will be supplied. Leave your suitcase in the car. After surgery it may be brought to your room. For patients admitted to the hospital, discharge time is determined by your treatment team.   Patients discharged the day of surgery will not be  allowed to drive home.   Please read over the following fact sheets that you were given:     __X__ Take these medicines the morning of surgery with A SIP OF WATER:    1. levothyroxine (SYNTHROID, LEVOTHROID) 100 MCG tablet  2.   3.   4.  5.  6.  ____ Fleet Enema (as directed)   ____ Use CHG Soap/SAGE wipes as directed  ____ Use inhalers on the day of surgery  ____ Stop metformin/Janumet/Farxiga 2 days prior to surgery    ____ Take 1/2 of usual insulin dose the night before surgery. No insulin the morning          of surgery.   ____ Stop Blood Thinners Coumadin/Plavix/Xarelto/Pleta/Pradaxa/Eliquis/Effient/Aspirin  on   Or contact your Surgeon, Cardiologist or Medical Doctor regarding  ability to stop your blood thinners  __X__ Stop Anti-inflammatories 7 days before surgery such as Advil, Ibuprofen, Motrin,  BC or Goodies Powder, Naprosyn, Naproxen, Aleve, Aspirin   May use Tylenol if needed  __X__ Stop all herbal supplements, fish oil or vitamin E until after surgery.    ____ Bring C-Pap to the hospital.      Su procedimiento est programado para: 22/12/22 Presntese en el DEPARTAMENTO DE CIRUGA DE DA UBICADO EN LA ENTRADA DEL CENTRO MDICO DEL 2. PISO. Para averiguar su hora de llegada, llame al [phone] entre la 1:00 p. m. y las 3:00 p. m. el 21/12/22.  Recuerde: las instrucciones que no se siguen por completo Armed forces logistics/support/administrative officer en un riesgo mdico grave, que puede incluir la [place], o segn el criterio de su cirujano y Scientific laboratory technician, es posible que sea Aeronautical engineer su Leisure centre manager.    _X__ 1. No ingiera alimentos ni beba lquidos despus de la medianoche anterior a su procedimiento.                 No masticar chicle ni caramelos duros.  __X__2. En la maana de la ciruga cepllese los dientes con pasta dental y agua, [place] enjuagarse la boca con enjuague bucal si lo desea. No trague ninguna pasta de dientes o enjuague bucal.   _X__ 3. Nada de alcohol por  24 horas antes o despus de la [place].   _X__ 4. No fume ni use cigarrillos electrnicos durante las 24 horas previas a su [place].                 No use ningn producto de tabaco masticable durante al menos 6 horas antes de                 [place].  ____ 5. Traiga todos los medicamentos con usted el da de la ciruga si se lo indicaron.  __X__ 6. Notifique a su mdico si hay algn cambio en su condicin mdica (resfriado, fiebre, infecciones).   No use joyas, maquillaje, horquillas para el cabello, clips o esmalte de uas. No use lociones, talcos ni perfumes. No se afeite el vello corporal 48 horas antes de la [place]. Los hombres pueden [employer] cara y el cuello. No lleve objetos de valor al hospital.  [facility] no es responsable de ninguna pertenencia u objeto de Licensed conveyancer.  No se pueden usar lentes de contacto, dentaduras postizas/parciales o perforaciones corporales en la ciruga. Traiga un estuche para sus lentes de contacto, anteojos o audfonos, se le proporcionar una copa para dentadura postiza. Deja tu maleta en el coche. Despus de la [place], es posible que lo lleven a su habitacin. Para los pacientes ingresados en el hospital, la hora del alta la determina su equipo de tratamiento  Los pacientes dados de alta el da de la ciruga no podrn Science writer a Higher education careers adviser.  Por favor, lea las siguientes hojas informativas que le dieron:    _X__ [employer] la maana de la ciruga con UN SORBO DE AGUA:  1. tableta de 100 MCG de levotiroxina (SYNTHROID, LEVOTHROID) 2. 3. 4. 5. 6.  ____ Fleet Enema (segn las indicaciones)  ____ Use CHG Soap/SAGE toallitas como se indica  ____ Usar inhaladores el da de la ciruga  ____ Suspender metformina/Janumet/Farxiga 2 das antes de la ciruga  ____ [employer] mitad de la dosis habitual de insulina la noche anterior a la Leisure centre manager. Sin insulina por la maana          de [place]  ____ Suspenda los anticoagulantes  Coumadin/Plavix/Xarelto/Pleta/Pradaxa/Eliquis/Effient/Aspirin on O comunquese con su cirujano, cardilogo o mdico acerca de la capacidad para Youth worker  __X__ [employer] antiinflamatorios 7 das antes de la [place]  como Advil, Ibuprofen, Motrin, BC o Goodies Powder, Naprosyn, Naproxen, Aleve, Aspirin Puede usar Tylenol si es necesario  __X__ Suspenda todos los suplementos de hierbas, aceite de pescado o vitamina E hasta despus de la [place].  ____ Lenard Forth al hospital.

## 2021-06-08 ENCOUNTER — Ambulatory Visit: Payer: 59 | Admitting: Certified Registered Nurse Anesthetist

## 2021-06-08 ENCOUNTER — Other Ambulatory Visit: Payer: Self-pay

## 2021-06-08 ENCOUNTER — Ambulatory Visit: Payer: 59

## 2021-06-08 ENCOUNTER — Encounter
Admission: RE | Disposition: A | Discharge status: Home / Self Care | Payer: Self-pay | Source: Ambulatory Visit | Attending: Urology

## 2021-06-08 ENCOUNTER — Encounter: Payer: Self-pay | Admitting: Urology

## 2021-06-08 ENCOUNTER — Ambulatory Visit
Admission: RE | Admit: 2021-06-08 | Discharge: 2021-06-08 | Disposition: A | Discharge status: Home / Self Care | Payer: 59 | Source: Ambulatory Visit | Attending: Urology | Admitting: Urology

## 2021-06-08 DIAGNOSIS — Z87442 Personal history of urinary calculi: Secondary | ICD-10-CM | POA: Diagnosis not present

## 2021-06-08 DIAGNOSIS — R109 Unspecified abdominal pain: Secondary | ICD-10-CM | POA: Insufficient documentation

## 2021-06-08 DIAGNOSIS — N200 Calculus of kidney: Secondary | ICD-10-CM | POA: Diagnosis not present

## 2021-06-08 DIAGNOSIS — Z87440 Personal history of urinary (tract) infections: Secondary | ICD-10-CM | POA: Diagnosis not present

## 2021-06-08 DIAGNOSIS — N9089 Other specified noninflammatory disorders of vulva and perineum: Secondary | ICD-10-CM | POA: Diagnosis not present

## 2021-06-08 HISTORY — PX: CYSTOSCOPY/URETEROSCOPY/HOLMIUM LASER/STENT PLACEMENT: SHX6546

## 2021-06-08 LAB — POCT PREGNANCY, URINE: Preg Test, Ur: NEGATIVE

## 2021-06-08 SURGERY — CYSTOSCOPY/URETEROSCOPY/HOLMIUM LASER/STENT PLACEMENT
Anesthesia: General | Laterality: Right

## 2021-06-08 MED ORDER — ORAL CARE MOUTH RINSE: 15.0000 mL | Freq: Once | OROMUCOSAL | Status: AC

## 2021-06-08 MED ORDER — GLYCOPYRROLATE 0.2 MG/ML IJ SOLN
INTRAMUSCULAR | Status: DC | PRN
  Administered 2021-06-08: .2 mg via INTRAVENOUS

## 2021-06-08 MED ORDER — FAMOTIDINE 20 MG PO TABS: 20.0000 mg | ORAL_TABLET | Freq: Once | ORAL | Status: AC

## 2021-06-08 MED ORDER — SUGAMMADEX SODIUM 200 MG/2ML IV SOLN
INTRAVENOUS | Status: DC | PRN
  Administered 2021-06-08: 200 mg via INTRAVENOUS

## 2021-06-08 MED ORDER — CHLORHEXIDINE GLUCONATE 0.12 % MT SOLN: 15.0000 mL | Freq: Once | OROMUCOSAL | Status: AC

## 2021-06-08 MED ORDER — LIDOCAINE HCL (CARDIAC) PF 100 MG/5ML IV SOSY
PREFILLED_SYRINGE | INTRAVENOUS | Status: DC | PRN
  Administered 2021-06-08: 80 mg via INTRAVENOUS

## 2021-06-08 MED ORDER — ACETAMINOPHEN 10 MG/ML IV SOLN
INTRAVENOUS | Status: AC
  Filled 2021-06-08: qty 100

## 2021-06-08 MED ORDER — PROPOFOL 10 MG/ML IV BOLUS
INTRAVENOUS | Status: DC | PRN
  Administered 2021-06-08: 150 mg via INTRAVENOUS

## 2021-06-08 MED ORDER — CHLORHEXIDINE GLUCONATE 0.12 % MT SOLN
OROMUCOSAL | Status: AC
  Administered 2021-06-08: 12:00:00 15 mL via OROMUCOSAL
  Filled 2021-06-08: qty 15

## 2021-06-08 MED ORDER — IOHEXOL 180 MG/ML  SOLN
INTRAMUSCULAR | Status: DC | PRN
  Administered 2021-06-08: 14:00:00 10 mL

## 2021-06-08 MED ORDER — CEFAZOLIN SODIUM-DEXTROSE 1-4 GM/50ML-% IV SOLN
INTRAVENOUS | Status: AC
  Filled 2021-06-08: qty 50

## 2021-06-08 MED ORDER — DEXAMETHASONE SODIUM PHOSPHATE 10 MG/ML IJ SOLN
INTRAMUSCULAR | Status: DC | PRN
  Administered 2021-06-08: 10 mg via INTRAVENOUS

## 2021-06-08 MED ORDER — CEFAZOLIN SODIUM-DEXTROSE 1-4 GM/50ML-% IV SOLN
1.0000 g | INTRAVENOUS | Status: AC
  Administered 2021-06-08 (×2): 1 g via INTRAVENOUS

## 2021-06-08 MED ORDER — FAMOTIDINE 20 MG PO TABS
ORAL_TABLET | ORAL | Status: AC
  Administered 2021-06-08: 12:00:00 20 mg via ORAL
  Filled 2021-06-08: qty 1

## 2021-06-08 MED ORDER — LACTATED RINGERS IV SOLN: INTRAVENOUS | Status: DC

## 2021-06-08 MED ORDER — ACETAMINOPHEN 10 MG/ML IV SOLN
INTRAVENOUS | Status: DC | PRN
  Administered 2021-06-08: 1000 mg via INTRAVENOUS

## 2021-06-08 MED ORDER — PROPOFOL 10 MG/ML IV BOLUS
INTRAVENOUS | Status: AC
  Filled 2021-06-08: qty 20

## 2021-06-08 MED ORDER — PROMETHAZINE HCL 25 MG/ML IJ SOLN: 6.2500 mg | INTRAMUSCULAR | Status: DC | PRN

## 2021-06-08 MED ORDER — FENTANYL CITRATE (PF) 100 MCG/2ML IJ SOLN: 25.0000 ug | INTRAMUSCULAR | Status: DC | PRN

## 2021-06-08 MED ORDER — GLYCOPYRROLATE 0.2 MG/ML IJ SOLN
INTRAMUSCULAR | Status: AC
  Filled 2021-06-08: qty 3

## 2021-06-08 MED ORDER — MIDAZOLAM HCL 2 MG/2ML IJ SOLN
INTRAMUSCULAR | Status: DC | PRN
  Administered 2021-06-08: 2 mg via INTRAVENOUS

## 2021-06-08 MED ORDER — FENTANYL CITRATE (PF) 100 MCG/2ML IJ SOLN
INTRAMUSCULAR | Status: AC
  Filled 2021-06-08: qty 2

## 2021-06-08 MED ORDER — MIDAZOLAM HCL 2 MG/2ML IJ SOLN
INTRAMUSCULAR | Status: AC
  Filled 2021-06-08: qty 2

## 2021-06-08 MED ORDER — ONDANSETRON HCL 4 MG/2ML IJ SOLN
INTRAMUSCULAR | Status: AC
  Filled 2021-06-08: qty 6

## 2021-06-08 MED ORDER — SODIUM CHLORIDE 0.9 % IR SOLN
Status: DC | PRN
  Administered 2021-06-08: 600 mL

## 2021-06-08 MED ORDER — ROCURONIUM BROMIDE 100 MG/10ML IV SOLN
INTRAVENOUS | Status: DC | PRN
  Administered 2021-06-08: 50 mg via INTRAVENOUS

## 2021-06-08 MED ORDER — ROCURONIUM BROMIDE 10 MG/ML (PF) SYRINGE
PREFILLED_SYRINGE | INTRAVENOUS | Status: AC
  Filled 2021-06-08: qty 10

## 2021-06-08 MED ORDER — DEXAMETHASONE SODIUM PHOSPHATE 10 MG/ML IJ SOLN
INTRAMUSCULAR | Status: AC
  Filled 2021-06-08: qty 3

## 2021-06-08 MED ORDER — TAMSULOSIN HCL 0.4 MG PO CAPS: 0.4000 mg | ORAL_CAPSULE | Freq: Every day | ORAL | 0 refills | Status: DC

## 2021-06-08 MED ORDER — ONDANSETRON HCL 4 MG/2ML IJ SOLN
INTRAMUSCULAR | Status: DC | PRN
  Administered 2021-06-08: 4 mg via INTRAVENOUS

## 2021-06-08 MED ORDER — OXYBUTYNIN CHLORIDE 5 MG PO TABS: 5.0000 mg | ORAL_TABLET | Freq: Three times a day (TID) | ORAL | 0 refills | Status: DC | PRN

## 2021-06-08 MED ORDER — FENTANYL CITRATE (PF) 100 MCG/2ML IJ SOLN
INTRAMUSCULAR | Status: DC | PRN
  Administered 2021-06-08: 50 ug via INTRAVENOUS

## 2021-06-08 MED ORDER — HYDROCODONE-ACETAMINOPHEN 5-325 MG PO TABS: 1.0000 | ORAL_TABLET | Freq: Four times a day (QID) | ORAL | 0 refills | Status: DC | PRN

## 2021-06-08 SURGICAL SUPPLY — 25 items
ADHESIVE MASTISOL STRL (MISCELLANEOUS) ×2 IMPLANT
BAG DRAIN CYSTO-URO LG1000N (MISCELLANEOUS) ×3 IMPLANT
BASKET ZERO TIP 1.9FR (BASKET) ×2 IMPLANT
BRUSH SCRUB EZ 1% IODOPHOR (MISCELLANEOUS) ×3 IMPLANT
CATH URETL OPEN 5X70 (CATHETERS) ×3 IMPLANT
DRAPE UTILITY 15X26 TOWEL STRL (DRAPES) ×3 IMPLANT
DRSG TEGADERM 2-3/8X2-3/4 SM (GAUZE/BANDAGES/DRESSINGS) ×2 IMPLANT
GAUZE 4X4 16PLY ~~LOC~~+RFID DBL (SPONGE) ×4 IMPLANT
GLOVE SURG ENC MOIS LTX SZ6.5 (GLOVE) ×3 IMPLANT
GOWN STRL REUS W/ TWL LRG LVL3 (GOWN DISPOSABLE) ×2 IMPLANT
GOWN STRL REUS W/TWL LRG LVL3 (GOWN DISPOSABLE) ×4
GUIDEWIRE GREEN .038 145CM (MISCELLANEOUS) ×2 IMPLANT
GUIDEWIRE STR DUAL SENSOR (WIRE) ×3 IMPLANT
INFUSOR MANOMETER BAG 3000ML (MISCELLANEOUS) ×3 IMPLANT
IV NS IRRIG 3000ML ARTHROMATIC (IV SOLUTION) ×3 IMPLANT
KIT TURNOVER CYSTO (KITS) ×3 IMPLANT
MANIFOLD NEPTUNE II (INSTRUMENTS) ×3 IMPLANT
PACK CYSTO AR (MISCELLANEOUS) ×3 IMPLANT
SET CYSTO W/LG BORE CLAMP LF (SET/KITS/TRAYS/PACK) ×3 IMPLANT
SHEATH URETERAL 12FRX35CM (MISCELLANEOUS) ×2 IMPLANT
STENT URET 6FRX24 CONTOUR (STENTS) ×2 IMPLANT
SURGILUBE 2OZ TUBE FLIPTOP (MISCELLANEOUS) ×3 IMPLANT
TRACTIP FLEXIVA PULSE ID 200 (Laser) ×3 IMPLANT
WATER STERILE IRR 1000ML POUR (IV SOLUTION) ×3 IMPLANT
WATER STERILE IRR 500ML POUR (IV SOLUTION) ×3 IMPLANT

## 2021-06-08 NOTE — Transfer of Care (Addendum)
Immediate Anesthesia Transfer of Care Note  Patient: Jasmine Henderson  Procedure(s) Performed: CYSTOSCOPY/URETEROSCOPY/HOLMIUM LASER/STENT PLACEMENT (Right)  Patient Location: PACU  Anesthesia Type:General  Level of Consciousness: sedated  Airway & Oxygen Therapy: Patient Spontanous Breathing and Patient connected to face mask oxygen  Post-op Assessment: Report given to RN and Post -op Vital signs reviewed and stable  Post vital signs: Reviewed and stable  Last Vitals:  Vitals Value Taken Time  BP 110/65   Temp    Pulse 83   Resp 13 06/08/21 1409  SpO2 100   Vitals shown include unvalidated device data.  Last Pain:  Vitals:   06/08/21 1139  TempSrc: Temporal  PainSc: 0-No pain         Complications: No notable events documented.

## 2021-06-08 NOTE — Discharge Instructions (Addendum)
AMBULATORY SURGERY  DISCHARGE INSTRUCTIONS   The drugs that you were given will stay in your system until tomorrow so for the next 24 hours you should not:  Drive an automobile Make any legal decisions Drink any alcoholic beverage   You may resume regular meals tomorrow.  Today it is better to start with liquids and gradually work up to solid foods.  You may eat anything you prefer, but it is better to start with liquids, then soup and crackers, and gradually work up to solid foods.   Please notify your doctor immediately if you have any unusual bleeding, trouble breathing, redness and pain at the surgery site, drainage, fever, or pain not relieved by medication.    Additional Instructions:   Please contact your physician with any problems or Same Day Surgery at [phone], Monday through Friday 6 am to 4 pm, or  at [facility] number at [phone]. You have a ureteral stent in place.  This is a tube that extends from your kidney to your bladder.  This may cause urinary bleeding, burning with urination, and urinary frequency.  Please call our office or present to the ED if you develop fevers >101 or pain which is not able to be controlled with oral pain medications.  You may be given either Flomax and/ or ditropan to help with bladder spasms and stent pain in addition to pain medications.    You may remove your stent on Sunday.  Untape and pull until the entire stent is removed.    [facility] [address] [place], [place] [address] [phone]

## 2021-06-08 NOTE — Anesthesia Procedure Notes (Signed)
Procedure Name: Intubation Date/Time: 06/08/2021 1:18 PM Performed by: Joanette Gula, Sherley Leser, CRNA Pre-anesthesia Checklist: Patient identified, Emergency Drugs available, Suction available and Patient being monitored Patient Re-evaluated:Patient Re-evaluated prior to induction Oxygen Delivery Method: Circle system utilized Preoxygenation: Pre-oxygenation with 100% oxygen Induction Type: IV induction Ventilation: Mask ventilation without difficulty Laryngoscope Size: McGraph and 3 Grade View: Grade I Tube type: Oral Tube size: 7.0 mm Number of attempts: 1 Airway Equipment and Method: Stylet Placement Confirmation: ETT inserted through vocal cords under direct vision, positive ETCO2 and breath sounds checked- equal and bilateral Secured at: 21 cm Tube secured with: Tape Dental Injury: Teeth and Oropharynx as per pre-operative assessment

## 2021-06-08 NOTE — Anesthesia Preprocedure Evaluation (Signed)
Anesthesia Evaluation  Patient identified by MRN, date of birth, ID band Patient awake    Reviewed: Allergy & Precautions, NPO status , Patient's Chart, lab work & pertinent test results, reviewed documented beta blocker date and time   History of Anesthesia Complications Negative for: history of anesthetic complications  Airway Mallampati: II  TM Distance: >3 FB     Dental  (+) Chipped, Dental Advidsory Given   Pulmonary neg pulmonary ROS,           Cardiovascular Exercise Tolerance: Good negative cardio ROS       Neuro/Psych Seizures -,  negative psych ROS   GI/Hepatic negative GI ROS, Neg liver ROS,   Endo/Other  neg diabetesHypothyroidism   Renal/GU Renal disease     Musculoskeletal   Abdominal   Peds  Hematology   Anesthesia Other Findings Past Medical History: No date: History of kidney stones No date: Hypothyroidism No date: Pre-diabetes No date: Seizures (HCC)     Comment:  pt states she had dizzy spell, passed out and was               seizing. unknown reason. NO treatment. neurologist could               not find anything No date: Thyroid disease  Reproductive/Obstetrics                             Anesthesia Physical  Anesthesia Plan  ASA: 2  Anesthesia Plan: General   Post-op Pain Management:    Induction: Intravenous  PONV Risk Score and Plan: 3 and Ondansetron, Dexamethasone, Midazolam and Treatment may vary due to age or medical condition  Airway Management Planned: Oral ETT  Additional Equipment:   Intra-op Plan:   Post-operative Plan: Extubation in OR  Informed Consent: I have reviewed the patients History and Physical, chart, labs and discussed the procedure including the risks, benefits and alternatives for the proposed anesthesia with the patient or authorized representative who has indicated his/her understanding and acceptance.       Plan  Discussed with: CRNA  Anesthesia Plan Comments:         Anesthesia Quick Evaluation

## 2021-06-08 NOTE — Op Note (Signed)
Date of procedure: 06/08/21  Preoperative diagnosis:  Right kidney stones   Postoperative diagnosis:  As above   Procedure: Right ureteroscopy with laser lithotripsy Right retrograde pyelogram Right ureteral stent placement Basket extraction of stone fragment Interpretation of fluoroscopy less than 30 min  Surgeon: Vanna Scotland, MD  Anesthesia: General  Complications: None  Intraoperative findings: 9 mm   EBL: minimal  Specimens: stone fragments  Drains: 6 x 24 Fr right ureteral stent placement on tether  Indication: Jasmine Henderson is a 45 y.o. patient with right kidney stones/ recurrent UTI.  After reviewing the management options for treatment, she elected to proceed with the above surgical procedure(s). We have discussed the potential benefits and risks of the procedure, side effects of the proposed treatment, the likelihood of the patient achieving the goals of the procedure, and any potential problems that might occur during the procedure or recuperation. Informed consent has been obtained.  Description of procedure:  The patient was taken to the operating room and general anesthesia was induced.  The patient was placed in the dorsal lithotomy position, prepped and draped in the usual sterile fashion, and preoperative antibiotics were administered. A preoperative time-out was performed.   A 21 French the scope was advanced per urethra into the bladder.  Attention was turned to the right ureteral orifice which was cannulated using a sensor wire which was placed up to the level of the kidney.  A dual-lumen access sheath was used to introduce a second Super Stiff wire as a working wire.  The sensor wire was excluded and snapped in place as a safety wire.  I then advanced a Adriana Simas 12/14 French ureteral access sheath under fluoroscopic guidance to the proximal ureter which went very easily.  A stool lumen 8 [place] will flexible digital ureteroscope was then advanced into  the renal pelvis.  The upper pole moiety was noted to be slightly stenotic opening into the infundibulum where there is some debris which was irrigated out.  A very soft almost staghorn-like calculus was seen emanating from mid lower pole calyx and a 242 m laser fiber was then brought in using dusting settings of 0.3 J and 50 Hz, the stone was dusted into innumerable small dust like particles.  A few the smaller pieces were basketed using a 1.9 [place] tipless nitinol basket.  I then irrigated the pieces extremely well.  A final retrograde pyelogram created roadmap to ensure that each every calyx was directly visualized.  There is no contrast extravasation or hydronephrosis.  No stones were seen fluoroscopically at the end of the procedure as they were in the beginning of the procedure.  The scope was then backed out like the ureter inspecting along the way.  There was no fragments or ureteral injuries appreciated.  Lastly, 6 x 24 French double-J ureteral stent was advanced over the wire up to the level of the kidney.  Upon withdrawal of the wire, there is a curl both within normal health renal pelvis as well as within the bladder.  The stent string was left attached to the distal coil of the stent which was affixed to the patient's left inner thigh using muscle and Tegaderm.  She was then cleaned and dried, repositioned supine position, reversed of anesthesia, taken to PACU in stable condition.  Plan: We will have her remove her own stent on Sunday.  We will have her follow-up in 4 weeks with a renal stone prior.  Vanna Scotland, M.D.

## 2021-06-08 NOTE — Interval H&P Note (Signed)
History and Physical Interval Note:  06/08/2021 12:49 PM  Jasmine Henderson  has presented today for surgery, with the diagnosis of Right Nephrolithiasis.  The various methods of treatment have been discussed with the patient and family. After consideration of risks, benefits and other options for treatment, the patient has consented to  Procedure(s): CYSTOSCOPY/URETEROSCOPY/HOLMIUM LASER/STENT PLACEMENT (Right) as a surgical intervention.  The patient's history has been reviewed, patient examined, no change in status, stable for surgery.  I have reviewed the patient's chart and labs.  Questions were answered to the patient's satisfaction.    RRR CTAB  Vanna Scotland

## 2021-06-09 ENCOUNTER — Encounter: Payer: Self-pay | Admitting: Urology

## 2021-06-09 NOTE — Anesthesia Postprocedure Evaluation (Signed)
Anesthesia Post Note  Patient: Jasmine Henderson  Procedure(s) Performed: CYSTOSCOPY/URETEROSCOPY/HOLMIUM LASER/STENT PLACEMENT (Right)  Patient location during evaluation: PACU Anesthesia Type: General Level of consciousness: awake and alert Pain management: pain level controlled Vital Signs Assessment: post-procedure vital signs reviewed and stable Respiratory status: spontaneous breathing, nonlabored ventilation, respiratory function stable and patient connected to nasal cannula oxygen Cardiovascular status: blood pressure returned to baseline and stable Postop Assessment: no apparent nausea or vomiting Anesthetic complications: no   No notable events documented.   Last Vitals:  Vitals:   06/08/21 1415 06/08/21 1454  BP: 116/77 125/81  Pulse: 67 62  Resp: 16 16  Temp:  (!) 36.1 C  SpO2: 100% 100%    Last Pain:  Vitals:   06/08/21 1454  TempSrc: Temporal  PainSc: 0-No pain                 Lenard Simmer

## 2021-06-11 ENCOUNTER — Other Ambulatory Visit: Payer: Self-pay

## 2021-06-11 ENCOUNTER — Emergency Department: Payer: 59

## 2021-06-11 ENCOUNTER — Emergency Department
Admission: EM | Admit: 2021-06-11 | Discharge: 2021-06-12 | Disposition: A | Discharge status: Home / Self Care | Payer: 59 | Attending: Emergency Medicine | Admitting: Emergency Medicine

## 2021-06-11 DIAGNOSIS — R109 Unspecified abdominal pain: Secondary | ICD-10-CM

## 2021-06-11 DIAGNOSIS — Z79899 Other long term (current) drug therapy: Secondary | ICD-10-CM | POA: Insufficient documentation

## 2021-06-11 DIAGNOSIS — R519 Headache, unspecified: Secondary | ICD-10-CM | POA: Diagnosis not present

## 2021-06-11 DIAGNOSIS — N3001 Acute cystitis with hematuria: Secondary | ICD-10-CM | POA: Diagnosis not present

## 2021-06-11 DIAGNOSIS — R509 Fever, unspecified: Secondary | ICD-10-CM | POA: Diagnosis not present

## 2021-06-11 DIAGNOSIS — Z20822 Contact with and (suspected) exposure to covid-19: Secondary | ICD-10-CM | POA: Insufficient documentation

## 2021-06-11 DIAGNOSIS — R1031 Right lower quadrant pain: Secondary | ICD-10-CM | POA: Diagnosis present

## 2021-06-11 DIAGNOSIS — E039 Hypothyroidism, unspecified: Secondary | ICD-10-CM | POA: Insufficient documentation

## 2021-06-11 DIAGNOSIS — R110 Nausea: Secondary | ICD-10-CM | POA: Diagnosis not present

## 2021-06-11 LAB — RESP PANEL BY RT-PCR (FLU A&B, COVID) ARPGX2
Influenza A by PCR: NEGATIVE
Influenza B by PCR: NEGATIVE
SARS Coronavirus 2 by RT PCR: NEGATIVE

## 2021-06-11 LAB — BASIC METABOLIC PANEL
Anion gap: 5 (ref 5–15)
BUN: 11 mg/dL (ref 6–20)
CO2: 26 mmol/L (ref 22–32)
Calcium: 9.2 mg/dL (ref 8.9–10.3)
Chloride: 100 mmol/L (ref 98–111)
Creatinine, Ser: 0.65 mg/dL (ref 0.44–1.00)
GFR, Estimated: >60 mL/min (ref >60)
Glucose, Bld: 126 mg/dL — ABNORMAL HIGH (ref 70–99)
Potassium: 3.8 mmol/L (ref 3.5–5.1)
Sodium: 131 mmol/L — ABNORMAL LOW (ref 135–145)

## 2021-06-11 LAB — URINALYSIS, ROUTINE W REFLEX MICROSCOPIC
Bilirubin Urine: NEGATIVE
Glucose, UA: NEGATIVE mg/dL
Ketones, ur: NEGATIVE mg/dL
Nitrite: NEGATIVE
Protein, ur: 30 mg/dL — AB
Specific Gravity, Urine: 1.01 (ref 1.005–1.030)
pH: 7 (ref 5.0–8.0)

## 2021-06-11 LAB — CBC
HCT: 39.5 % (ref 36.0–46.0)
Hemoglobin: 13.4 g/dL (ref 12.0–15.0)
MCH: 29.6 pg (ref 26.0–34.0)
MCHC: 33.9 g/dL (ref 30.0–36.0)
MCV: 87.4 fL (ref 80.0–100.0)
Platelets: 268 10*3/uL (ref 150–400)
RBC: 4.52 MIL/uL (ref 3.87–5.11)
RDW: 13.5 % (ref 11.5–15.5)
WBC: 6.5 10*3/uL (ref 4.0–10.5)
nRBC: 0 % (ref 0.0–0.2)

## 2021-06-11 LAB — URINALYSIS, MICROSCOPIC (REFLEX)

## 2021-06-11 LAB — POC URINE PREG, ED: Preg Test, Ur: NEGATIVE

## 2021-06-11 MED ORDER — CEPHALEXIN 500 MG PO CAPS: 500.0000 mg | ORAL_CAPSULE | Freq: Four times a day (QID) | ORAL | 0 refills | Status: AC

## 2021-06-11 MED ORDER — OXYCODONE HCL 5 MG PO TABS
5.0000 mg | ORAL_TABLET | Freq: Once | ORAL | Status: AC
  Administered 2021-06-11: 22:00:00 5 mg via ORAL
  Filled 2021-06-11: qty 1

## 2021-06-11 MED ORDER — CEPHALEXIN 500 MG PO CAPS
500.0000 mg | ORAL_CAPSULE | Freq: Once | ORAL | Status: AC
  Administered 2021-06-11: 500 mg via ORAL
  Filled 2021-06-11: qty 1

## 2021-06-11 MED ORDER — ONDANSETRON 4 MG PO TBDP
4.0000 mg | ORAL_TABLET | Freq: Once | ORAL | Status: AC
  Administered 2021-06-11: 22:00:00 4 mg via ORAL
  Filled 2021-06-11: qty 1

## 2021-06-11 MED ORDER — ACETAMINOPHEN 500 MG PO TABS
1000.0000 mg | ORAL_TABLET | Freq: Once | ORAL | Status: AC
  Administered 2021-06-11: 22:00:00 1000 mg via ORAL
  Filled 2021-06-11: qty 2

## 2021-06-11 NOTE — ED Notes (Signed)
Provided patient with saltine crackers and water for abx admin PO

## 2021-06-11 NOTE — ED Provider Notes (Signed)
[facility] Emergency Department Provider Note ____________________________________________   Event Date/Time   First MD Initiated Contact with Patient 06/11/21 2048     (approximate)  I have reviewed the triage vital signs and the nursing notes.  HISTORY  Chief Complaint Flank Pain   HPI Jasmine Henderson is a 45 y.o. femalewho presents to the ED for evaluation of nausea, headache and right flank pain.   Chart review indicates history nephrolithiasis and recent urologic procedure 3 days ago.  Had a ureteroscopy with laser lithotripsy on the right as well as a ureteral stent placed.  Patient presents to the ED, accompanied by her son who provides some additional history and translates.  I offered translator services, but she refuses this and prefers her son.  She primarily reports that for the past 2-3 days she has had increasing malaise, headache and nausea.  She reports improved pain with Tylenol, ibuprofen and postoperative Norco.  Secondary to this, she reports some increasing right flank pain over the past 1 day.  Reports concurrent passage of small blood clots with her urine.  Denies dysuria or other bleeding diatheses such as melena, hematochezia, epistaxis.  Currently reporting 3 or 4/10 intensity aching pain to her right flank and a similar intensity headache that is nonradiating.  Past Medical History:  Diagnosis Date   History of kidney stones    Hypothyroidism    Pre-diabetes    Seizures ([place])    pt states she had dizzy spell, passed out and was seizing. unknown reason. NO treatment. 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    Past Surgical History:  Procedure Laterality  Date   CARDIAC CATHETERIZATION     pt denies 06/01/21   CYSTOSCOPY/URETEROSCOPY/HOLMIUM LASER/STENT PLACEMENT Left 09/24/2017   Procedure: CYSTOSCOPY/URETEROSCOPY/left retrorade pyleogram;  Surgeon: Abbie Sons, MD;  Location: [facility] ORS;  Service: Urology;  Laterality: Left;   CYSTOSCOPY/URETEROSCOPY/HOLMIUM LASER/STENT PLACEMENT Right 12/18/2017   Procedure: CYSTOSCOPY/URETEROSCOPY/HOLMIUM LASER/STENT Exchange;  Surgeon: Hollice Espy, MD;  Location: [facility] ORS;  Service: Urology;  Laterality: Right;   CYSTOSCOPY/URETEROSCOPY/HOLMIUM LASER/STENT PLACEMENT Right 06/08/2021   Procedure: CYSTOSCOPY/URETEROSCOPY/HOLMIUM LASER/STENT PLACEMENT;  Surgeon: Hollice Espy, MD;  Location: [facility] ORS;  Service: Urology;  Laterality: Right;   IR NEPHROSTOMY PLACEMENT RIGHT  11/25/2017   NEPHROLITHOTOMY Right 11/25/2017   Procedure: NEPHROLITHOTOMY PERCUTANEOUS;  Surgeon: Hollice Espy, MD;  Location: [facility] ORS;  Service: Urology;  Laterality: Right;    Prior to Admission medications   Medication Sig Start Date End Date Taking? Authorizing Provider  cephALEXin (KEFLEX) 500 MG capsule Take 1 capsule (500 mg total) by mouth 4 (four) times daily for 5 days. 06/11/21 06/16/21 Yes Vladimir Crofts, MD  HYDROcodone-acetaminophen (NORCO/VICODIN) 5-325 MG tablet Take 1-2 tablets by mouth every 6 (six) hours as needed for moderate pain. 06/08/21   Hollice Espy, MD  levothyroxine (SYNTHROID, LEVOTHROID) 100 MCG tablet Take 100 mcg by mouth daily before breakfast.     [provider]  oxybutynin (DITROPAN) 5 MG tablet Take 1 tablet (5 mg total) by mouth every 8 (eight) hours as needed for bladder spasms. 06/08/21   Hollice Espy, MD  tamsulosin (FLOMAX) 0.4 MG CAPS capsule Take 1 capsule (0.4 mg total) by mouth daily. 06/08/21   Erlene Quan,  Morrie Sheldon, MD    Allergies Patient has no known allergies.  Family History  Problem Relation Age of Onset   Bladder Cancer Neg Hx    Kidney cancer Neg Hx      Social History Social History   Tobacco Use   Smoking status: Never   Smokeless tobacco: Never  Vaping Use   Vaping Use: Never used  Substance Use Topics   Alcohol use: No   Drug use: No    Review of Systems  Constitutional: Positive for subjective chills and malaise Eyes: No visual changes. ENT: No sore throat. Cardiovascular: Denies chest pain. Respiratory: Denies shortness of breath. Gastrointestinal: No abdominal pain.  No nausea, no vomiting.  No diarrhea.  No constipation. Genitourinary: Negative for dysuria.  Positive hematuria Musculoskeletal: Negative for back pain.  Positive for atraumatic right flank pain. Skin: Negative for rash. Neurological: Negative for  focal weakness or numbness.  Positive for headache  ____________________________________________   PHYSICAL EXAM:  VITAL SIGNS: Vitals:   06/11/21 2031 06/11/21 2338  BP: (!) 153/104 99/79  Pulse: (!) 133 90  Resp: 18 20  Temp: 99.3 F (37.4 C)   SpO2: 97% 99%    Constitutional: Alert and oriented. Well appearing and in no acute distress.  Sitting up in bed and conversational. Eyes: Conjunctivae are normal. PERRL. EOMI. Head: Atraumatic. Nose: No congestion/rhinnorhea. Mouth/Throat: Mucous membranes are moist.  Oropharynx non-erythematous. Neck: No stridor. No cervical spine tenderness to palpation. Cardiovascular: Tachycardic rate, regular rhythm. Good peripheral circulation. Respiratory: Normal respiratory effort.  No retractions. Lungs CTAB. Gastrointestinal: Soft , nondistended, nontender to palpation.  Mild and poorly localizing right flank and right CVA tenderness.  No suprapubic tenderness or any anterior abdominal tenderness. Musculoskeletal: No joint effusions. No signs of acute trauma. Neurologic:  Normal speech and language. No gross focal neurologic deficits are appreciated. No gait instability noted. Cranial nerves II through XII intact 5/5 strength and sensation in all 4  extremities Skin:  Skin is warm, dry and intact. No rash noted. Psychiatric: Mood and affect are normal. Speech and behavior are normal. ____________________________________________   LABS (all labs ordered are listed, but only abnormal results are displayed)  Labs Reviewed  URINALYSIS, ROUTINE W REFLEX MICROSCOPIC - Abnormal; Notable for the following components:      Result Value   APPearance HAZY (*)    Hgb urine dipstick LARGE (*)    Protein, ur 30 (*)    Leukocytes,Ua LARGE (*)    All other components within normal limits  BASIC METABOLIC PANEL - Abnormal; Notable for the following components:   Sodium 131 (*)    Glucose, Bld 126 (*)    All other components within normal limits  URINALYSIS, MICROSCOPIC (REFLEX) - Abnormal; Notable for the following components:   Bacteria, UA RARE (*)    All other components within normal limits  RESP PANEL BY RT-PCR (FLU A&B, COVID) ARPGX2  URINE CULTURE  CBC  POC URINE PREG, ED   ____________________________________________  12 Lead EKG   ____________________________________________  RADIOLOGY  ED MD interpretation: CT renal study reviewed by me with decompressed right ureter with stent in place and no acute ureterolithiasis.  Official radiology report(s): CT Renal Stone Study  Result Date: 06/11/2021 CLINICAL DATA:  History of nephrolithiasis and recent right-sided lithotripsy with increasing pain, initial encounter EXAM: CT ABDOMEN AND PELVIS WITHOUT CONTRAST TECHNIQUE: Multidetector CT imaging of the abdomen and pelvis was performed following the standard protocol without IV contrast. COMPARISON:  03/29/2021, 06/08/2021 FINDINGS: Lower chest:  No acute abnormality. Hepatobiliary: Fatty infiltration of the liver is noted. Gallbladder is within normal limits. Pancreas: Unremarkable. No pancreatic ductal dilatation or surrounding inflammatory changes. Spleen: Normal in size without focal abnormality. Adrenals/Urinary Tract: Adrenal  glands are within normal limits. Left kidney demonstrates no renal calculi or obstructive changes. The urine left ureter is within normal limits. Right kidney demonstrates a tiny 1 mm upper pole nonobstructing stone. Stable calcifications are noted along the tract of a previous right nephrostomy catheter. Right ureteral stent is noted in place. No ureteral stones are seen. Bladder is decompressed. Stomach/Bowel: No obstructive or inflammatory changes of the colon are noted. The appendix is well visualized and within normal limits. Stomach and small bowel are unremarkable. Vascular/Lymphatic: No significant vascular findings are present. No enlarged abdominal or pelvic lymph nodes. Reproductive: Prostate is unremarkable. Other: No abdominal wall hernia or abnormality. No abdominopelvic ascites. Musculoskeletal: No acute or significant osseous findings. IMPRESSION: Right ureteral stent in satisfactory position. No residual stone fragments are identified in the right ureter. Tiny nonobstructing right upper pole renal stone. No other focal abnormality is noted. Electronically Signed   By: Inez Catalina M.D.   On: 06/11/2021 21:51    ____________________________________________   PROCEDURES and INTERVENTIONS  Procedure(s) performed (including Critical Care):  Procedures  Medications  cephALEXin (KEFLEX) capsule 500 mg (has no administration in time range)  acetaminophen (TYLENOL) tablet 1,000 mg (1,000 mg Oral Given 06/11/21 2200)  oxyCODONE (Oxy IR/ROXICODONE) immediate release tablet 5 mg (5 mg Oral Given 06/11/21 2200)  ondansetron (ZOFRAN-ODT) disintegrating tablet 4 mg (4 mg Oral Given 06/11/21 2200)    ____________________________________________   MDM / ED COURSE   45 year old female presents to the ED with nausea and right flank pain, possibly symptomatic superimposed acute cystitis amenable to outpatient management with initiation of antibiotics.  Tachycardic in triage and afebrile.  Pain  well controlled with 1 rounds of oral analgesia.  Blood work without leukocytosis or further stigmata of sepsis.  Renal function intact and urine with large leukocytes concerning for superimposed infection considering her new hematuria and pain.  Tested negative for influenza and COVID to have caused her nausea and headache.  She looks well and I see no barriers to outpatient management.  We will start a short course of Keflex and have her follow-up with urology.  Clinical Course as of 06/11/21 2344  Nancy Fetter Jun 11, 2021  2249 Reassessed.  Flank pain has resolved.  Still reporting mild headache, though improved.  We discussed CT results and urinalysis results.  We discussed a few days of antibiotics to cover for infectious pathology and they are agreeable.  We discussed possibility of influenza as well. [DS]  2344 Reassessed.  Feeling better.  We discussed work-up and empiric antibiotics with outpatient management, following with urology and return precautions to the ED. [DS]    Clinical Course User Index [DS] Vladimir Crofts, MD    ____________________________________________   FINAL CLINICAL IMPRESSION(S) / ED DIAGNOSES  Final diagnoses:  Right flank pain  Acute cystitis with hematuria     ED Discharge Orders          Ordered    cephALEXin (KEFLEX) 500 MG capsule  4 times daily        06/11/21 2251             Nylene Inlow   Note:  This document was prepared using Dragon voice recognition software and may include unintentional dictation errors.    Vladimir Crofts, MD 06/11/21 [phone]

## 2021-06-11 NOTE — ED Triage Notes (Signed)
Pt via POV with family member who is assisting with interpretation. Pt had a surgical procedure on Dec 22 to break up her kidney stones and place a stent. Then last night she began having bilateral flank pain, right worse than left, with some hematuria, nausea, vomiting, headache, low grade fever. She was prescribed norco 5-325mg  q6h PRN, oxybutynin 5mg  q8h prn, and tamsulosin 0.4mg  qd which she has been taking with tylenol and ibuprofen. Last doses were taken around 1pm today. Ibuprofen was taken around 1930pm. Pain currently rated 3-4/10 at rest, worse when urinating to about 6/10.

## 2021-06-11 NOTE — ED Notes (Signed)
Unable to obtain signature at time of triage due to signature pad not working.  

## 2021-06-11 NOTE — ED Notes (Signed)
Pt to CT

## 2021-06-11 NOTE — ED Notes (Signed)
Pt c/o right flank pain that radiates across her back to her left flank. She reports nausea today. She c/o pain 4/10 for flank pain but worsens when she urinates. Pt denies bladder or abdominal pain or pain while urinating in those areas. Pt c/o continued headache with pain 4/10. Pt reports has still been able to drink and eat despite nausea and reports she feels she has been drinking enough water.

## 2021-06-11 NOTE — Discharge Instructions (Signed)
Start Keflex antibiotics 4 times daily for the next 5 days to treat a UTI.  Follow-up with the urologist in the clinic.  Please take Tylenol and ibuprofen/Advil for your pain.  It is safe to take them together, or to alternate them every few hours.  Take up to 1000mg  of Tylenol at a time, up to 4 times per day.  Do not take more than 4000 mg of Tylenol in 24 hours.  For ibuprofen, take 400-600 mg, 4-5 times per day.

## 2021-06-11 NOTE — ED Triage Notes (Signed)
Pt to ED via POV with son with c/o Chills, fever and lower back kidney pain. She recently has surgery on her "kidney stones"

## 2021-06-12 ENCOUNTER — Other Ambulatory Visit: Payer: Self-pay

## 2021-06-12 ENCOUNTER — Emergency Department
Admission: EM | Admit: 2021-06-12 | Discharge: 2021-06-13 | Disposition: A | Discharge status: Home / Self Care | Payer: 59 | Source: Home / Self Care

## 2021-06-12 ENCOUNTER — Encounter: Payer: Self-pay | Admitting: *Deleted

## 2021-06-12 DIAGNOSIS — R110 Nausea: Secondary | ICD-10-CM | POA: Insufficient documentation

## 2021-06-12 DIAGNOSIS — Z5321 Procedure and treatment not carried out due to patient leaving prior to being seen by health care provider: Secondary | ICD-10-CM | POA: Insufficient documentation

## 2021-06-12 DIAGNOSIS — R509 Fever, unspecified: Secondary | ICD-10-CM | POA: Insufficient documentation

## 2021-06-12 LAB — BASIC METABOLIC PANEL
Anion gap: 7 (ref 5–15)
BUN: 11 mg/dL (ref 6–20)
CO2: 23 mmol/L (ref 22–32)
Calcium: 8.9 mg/dL (ref 8.9–10.3)
Chloride: 100 mmol/L (ref 98–111)
Creatinine, Ser: 0.73 mg/dL (ref 0.44–1.00)
GFR, Estimated: >60 mL/min (ref >60)
Glucose, Bld: 141 mg/dL — ABNORMAL HIGH (ref 70–99)
Potassium: 3.5 mmol/L (ref 3.5–5.1)
Sodium: 130 mmol/L — ABNORMAL LOW (ref 135–145)

## 2021-06-12 LAB — URINALYSIS, ROUTINE W REFLEX MICROSCOPIC
Bilirubin Urine: NEGATIVE
Glucose, UA: NEGATIVE mg/dL
Ketones, ur: NEGATIVE mg/dL
Nitrite: NEGATIVE
Protein, ur: NEGATIVE mg/dL
Specific Gravity, Urine: 1.01 (ref 1.005–1.030)
pH: 6.5 (ref 5.0–8.0)

## 2021-06-12 LAB — URINALYSIS, MICROSCOPIC (REFLEX): WBC, UA: >50 WBC/hpf (ref 0–5)

## 2021-06-12 LAB — CBC
HCT: 38.4 % (ref 36.0–46.0)
Hemoglobin: 13.1 g/dL (ref 12.0–15.0)
MCH: 29.5 pg (ref 26.0–34.0)
MCHC: 34.1 g/dL (ref 30.0–36.0)
MCV: 86.5 fL (ref 80.0–100.0)
Platelets: 229 10*3/uL (ref 150–400)
RBC: 4.44 MIL/uL (ref 3.87–5.11)
RDW: 13.4 % (ref 11.5–15.5)
WBC: 8.1 10*3/uL (ref 4.0–10.5)
nRBC: 0 % (ref 0.0–0.2)

## 2021-06-12 LAB — LACTIC ACID, PLASMA: Lactic Acid, Venous: 1.3 mmol/L (ref 0.5–1.9)

## 2021-06-12 NOTE — ED Triage Notes (Signed)
PT to ED reporting continued fever, dizziness and nausea. Pt was seen and evaluated last night but reports feeling worse today. Recent urinary stent placement and pt reports fear of infection.

## 2021-06-12 NOTE — ED Notes (Signed)
Went over discharges with pt and son is at bedside to help clarify any questions due to patient speaking primarily [place]. Patient verbalized understanding of discharges and medications and followup recommendation with urology and son verbalized understanding also.

## 2021-06-13 ENCOUNTER — Telehealth: Payer: Self-pay | Admitting: *Deleted

## 2021-06-13 DIAGNOSIS — N200 Calculus of kidney: Secondary | ICD-10-CM

## 2021-06-13 LAB — URINE CULTURE: Culture: 90000 — AB

## 2021-06-13 NOTE — ED Notes (Signed)
No answer when called several times from lobby 

## 2021-06-13 NOTE — Addendum Note (Signed)
Addended by: Milas Kocher A on: 06/13/2021 10:44 AM   Modules accepted: Orders

## 2021-06-13 NOTE — Telephone Encounter (Signed)
Daughter Lindwood Coke called Triage line to report mother has been having headaches with chills. She was seen in ER on 06/11/21 was placed on Keflex. She removed stent on 06/12/21 with no complications. Counseled to continue antibiotics, stay hydrated, take OTC pain relievers and eat small meals. Per Hilton Sinclair, PA if fever or symptoms worsen need to seek medical attention. Voiced understanding.

## 2021-06-16 ENCOUNTER — Ambulatory Visit
Admission: RE | Admit: 2021-06-16 | Discharge: 2021-06-16 | Disposition: A | Discharge status: Home / Self Care | Payer: 59 | Source: Ambulatory Visit | Attending: Physician Assistant | Admitting: Physician Assistant

## 2021-06-16 DIAGNOSIS — N200 Calculus of kidney: Secondary | ICD-10-CM | POA: Diagnosis not present

## 2021-06-21 ENCOUNTER — Telehealth: Payer: Self-pay

## 2021-06-21 NOTE — Telephone Encounter (Signed)
Using interpreter services, interpreter [phone] contacted patient on my behalf. Patient reports she is feeling better. Advised patient of follow up appt in April. Patient would like to know the results of her ultrasound. Please advise.

## 2021-06-21 NOTE — Telephone Encounter (Signed)
-----   Message from Carman Ching, [place] sent at 06/20/2021  5:55 PM EST ----- Can you please call and recheck her symptoms? If she's feeling better, ok to keep follow-up as scheduled. If still having pain or feeling ill, ok to bring her in this week with UA and vitals including temp. ----- Message ----- From: Interface, Rad Results In Sent: 06/16/2021  10:45 PM EST To: Carman Ching, PA-C

## 2021-06-22 ENCOUNTER — Other Ambulatory Visit: Payer: Self-pay

## 2021-06-22 DIAGNOSIS — N200 Calculus of kidney: Secondary | ICD-10-CM

## 2021-06-22 NOTE — Telephone Encounter (Signed)
OK per DPR, called pt's daughter Kandis Cocking to notify her of RUS rx. Pt denies any pain at this time. Advised pt that Carollee Herter recommends repeat RUS in 2 weeks. Order placed. Advised pt someone will contact her pending those results. Pt expressed understanding.

## 2021-07-07 ENCOUNTER — Other Ambulatory Visit: Payer: Self-pay

## 2021-07-07 ENCOUNTER — Ambulatory Visit
Admission: RE | Admit: 2021-07-07 | Discharge: 2021-07-07 | Disposition: A | Discharge status: Home / Self Care | Payer: 59 | Source: Ambulatory Visit | Attending: Urology | Admitting: Urology

## 2021-07-07 DIAGNOSIS — N200 Calculus of kidney: Secondary | ICD-10-CM | POA: Diagnosis present

## 2021-07-10 NOTE — Progress Notes (Signed)
07/11/21 12:04 PM   Jasmine Henderson 1976-06-17 [id number]  Referring provider:  [facility] [address] [place],  [place] [address] Chief Complaint  Patient presents with   Nephrolithiasis     HPI: Jasmine Henderson is a 46 y.o.female with a personal history  recurrent UTI, nephrolithiasis including full right staghorn calculus who underwent PCNL with Dr. Apolinar Junes in 2019, left ureterocele, and chronic left hydronephrosis , who returns today for a 4 week follow-up.   She underwent KUB after spontaneously passing a stone on 03/29/2021, which revealed some interval enlargement of a right lower pole stone now extending into the infundibulum.   She is s/p right ureteroscopy with laser lithotripsy, retrograde pyelogram, ureteral stent placement, basket extraction of stone fragment on 06/08/2021. Intraoperative findings showed 61mm stone.   CT renal stone study on 06/11/2021 visualized No residual stone fragments are identified in the right ureter. Tiny nonobstructing right upper pole renal stone. No other focal abnormality is noted.  Calcifications noted on the parenchymal margin of the kidney related to PCNL tract.  RUS on 07/07/2021 visualized resolution of right hydronephrosis and small 7 mm right kidney stone. Similar left pelviectasis without frank hydronephrosis. Dilated distal left ureter with 1.6 cm ureterocele at the bladder.  She is accompanied by a Engineer, structural today.She reports today that she has pain her vaginal area which is constant unrelated to voiding and she believes she may be infected and would like to leave urine today.  This is one of her chronic complaints.  She also continues to have chronic mild right flank pain, longstanding.  PMH: Past Medical History:  Diagnosis Date   History of kidney stones    Hypothyroidism    Pre-diabetes    Seizures ([facility])    pt states she had dizzy spell, passed out and was  seizing. unknown reason. NO treatment. neurologist could not find anything   Thyroid disease     Surgical History: Past Surgical History:  Procedure Laterality Date   CARDIAC CATHETERIZATION     pt denies 06/01/21   CYSTOSCOPY/URETEROSCOPY/HOLMIUM LASER/STENT PLACEMENT Left 09/24/2017   Procedure: CYSTOSCOPY/URETEROSCOPY/left retrorade pyleogram;  Surgeon: Riki Altes, MD;  Location: [facility] ORS;  Service: Urology;  Laterality: Left;   CYSTOSCOPY/URETEROSCOPY/HOLMIUM LASER/STENT PLACEMENT Right 12/18/2017   Procedure: CYSTOSCOPY/URETEROSCOPY/HOLMIUM LASER/STENT Exchange;  Surgeon: Vanna Scotland, MD;  Location: [facility];  Service: Urology;  Laterality: Right;   CYSTOSCOPY/URETEROSCOPY/HOLMIUM LASER/STENT PLACEMENT Right 06/08/2021   Procedure: CYSTOSCOPY/URETEROSCOPY/HOLMIUM LASER/STENT PLACEMENT;  Surgeon: Vanna Scotland, MD;  Location: [facility];  Service: Urology;  Laterality: Right;   IR NEPHROSTOMY PLACEMENT RIGHT  11/25/2017   NEPHROLITHOTOMY Right 11/25/2017   Procedure: NEPHROLITHOTOMY PERCUTANEOUS;  Surgeon: Vanna Scotland, MD;  Location: [facility] ORS;  Service: Urology;  Laterality: Right;    Home Medications:  Allergies as of 07/11/2021   No Known Allergies      Medication List        Accurate as of July 11, 2021 12:04 PM. If you have any questions, ask your nurse or doctor.          STOP taking these medications    HYDROcodone-acetaminophen 5-325 MG tablet Commonly known as: NORCO/VICODIN Stopped by: Vanna Scotland, MD   oxybutynin 5 MG tablet Commonly known as: DITROPAN Stopped by: Vanna Scotland, MD   tamsulosin 0.4 MG Caps capsule Commonly known as: Flomax Stopped by: Vanna Scotland, MD       TAKE these medications    levothyroxine 100 MCG tablet Commonly known as: SYNTHROID  Take 100 mcg by mouth daily before breakfast.        Allergies: No Known Allergies  Family History: Family History  Problem Relation Age of Onset   Bladder  Cancer Neg Hx    Kidney cancer Neg Hx     Social History:  reports that she has never smoked. She has never used smokeless tobacco. She reports that she does not drink alcohol and does not use drugs.   Physical Exam: BP 136/89    Pulse 82    Ht 5\' 2"  (1.575 m)    Wt 160 lb (72.6 kg)    BMI 29.26 kg/m   Constitutional:  Alert and oriented, No acute distress.  Accompanied by a today. HEENT: [place] AT, moist mucus membranes.  Trachea midline, no masses. Cardiovascular: No clubbing, cyanosis, or edema. Respiratory: Normal respiratory effort, no increased work of breathing. Skin: No rashes, bruises or suspicious lesions. Neurologic: Grossly intact, no focal deficits, moving all 4 extremities. Psychiatric: Normal mood and affect.  Laboratory Data:  Lab Results  Component Value Date   CREATININE 0.73 06/12/2021    Pertinent Imaging: CLINICAL DATA:  Right nephrolithiasis   EXAM: RENAL / URINARY TRACT ULTRASOUND COMPLETE   COMPARISON:  06/16/2021 ultrasound, CT 06/11/2021   FINDINGS: Right Kidney:   Renal measurements: 10.5 x 5.1 x 5 cm = volume: 140.9 mL. Echogenicity within normal limits. No mass or hydronephrosis. Small shadowing stone lower pole measuring 7 mm. Small cortex calcifications at the midpole of the right kidney measuring 7 mm.   Left Kidney:   Renal measurements: 11.9 x 6.2 x 5.1 cm = volume: 197.3 mL. Echogenicity within normal limits. Mild pelviectasis without calyceal dilatation. Distal hydroureter with 1.6 cm ureterocele at the bladder.   Bladder:   Appears normal for degree of bladder distention.   Other:   None.   IMPRESSION: 1. Resolution of right hydronephrosis. Small 7 mm right kidney stone 2. Similar left pelviectasis without frank hydronephrosis. Dilated distal left ureter with 1.6 cm ureterocele at the bladder.     Electronically Signed   By: 06/13/2021 M.D.   On: 07/08/2021 18:27  Renal ultrasound personally  reviewed, compared to CT scan from 06/11/2021.  No significant residual renal pelvic calcifications in the right kidney, chronic calcification in the right lower pole at parenchymal margin related PCNL tract which is likely what is noted on renal ultrasound today.   Assessment & Plan:    Right kidney stone  - S/p ureteroscopy  - CT study and RUS visualized resolution of stone burden  -Chronic right flank pain stable -Pictures drawn again today explaining that her residual stone is not in fact concerning given its location outside of the collecting system, should not be causing pain and/or infection  2.  Vulvar burning/itching -Again suspect this is not likely be related to one of her chronic complaints, advised to follow-up with OB/GYN -As a courtesy, she was offered a urinalysis today which was in fact somewhat suspicious although she may also be chronically colonized -Will send urine culture and treat as appropriate  Return in 1 year for KUB   06/13/2021 as a scribe for Tawni Millers, MD.,have documented all relevant documentation on the behalf of Vanna Scotland, MD,as directed by  Vanna Scotland, MD while in the presence of Vanna Scotland, MD.   [facility] [address], [address] [place], [place] [phone]  I spent 31 total minutes on the day of the encounter  including pre-visit review of the medical record, face-to-face time with the patient, and post visit ordering of labs/imaging/tests.  The majority of the time today was spent face-to-face with the patient drawing her pictures and answering her questions

## 2021-07-11 ENCOUNTER — Encounter: Payer: Self-pay | Admitting: Urology

## 2021-07-11 ENCOUNTER — Ambulatory Visit (INDEPENDENT_AMBULATORY_CARE_PROVIDER_SITE_OTHER): Payer: 59 | Admitting: Urology

## 2021-07-11 ENCOUNTER — Other Ambulatory Visit: Payer: Self-pay

## 2021-07-11 VITALS — BP 136/89 | HR 82 | Ht 62.0 in | Wt 160.0 lb

## 2021-07-11 DIAGNOSIS — N200 Calculus of kidney: Secondary | ICD-10-CM | POA: Diagnosis not present

## 2021-07-11 DIAGNOSIS — R300 Dysuria: Secondary | ICD-10-CM | POA: Diagnosis not present

## 2021-07-11 LAB — URINALYSIS, COMPLETE
Bilirubin, UA: NEGATIVE
Glucose, UA: NEGATIVE
Ketones, UA: NEGATIVE
Nitrite, UA: NEGATIVE
Protein,UA: NEGATIVE
Specific Gravity, UA: 1.01 (ref 1.005–1.030)
Urobilinogen, Ur: 0.2 mg/dL (ref 0.2–1.0)
pH, UA: 7 (ref 5.0–7.5)

## 2021-07-11 LAB — MICROSCOPIC EXAMINATION

## 2021-07-12 ENCOUNTER — Ambulatory Visit: Payer: Self-pay | Admitting: Urology

## 2021-07-14 ENCOUNTER — Other Ambulatory Visit: Payer: Self-pay | Admitting: Urology

## 2021-07-15 LAB — CULTURE, URINE COMPREHENSIVE

## 2021-07-17 ENCOUNTER — Telehealth: Payer: Self-pay | Admitting: *Deleted

## 2021-07-17 NOTE — Telephone Encounter (Addendum)
Unable to reach patient via interpreter services-cannot leave VM  ----- Message from Vanna Scotland, MD sent at 07/17/2021 12:24 PM EST ----- Urine culture ended up growing Morganella.  Please treat with Bactrim DS twice daily for 5 days.  Vanna Scotland, MD

## 2021-07-18 ENCOUNTER — Telehealth: Payer: Self-pay | Admitting: *Deleted

## 2021-07-18 MED ORDER — SULFAMETHOXAZOLE-TRIMETHOPRIM 800-160 MG PO TABS: 1.0000 | ORAL_TABLET | Freq: Two times a day (BID) | ORAL | 0 refills | Status: DC

## 2021-07-18 NOTE — Telephone Encounter (Addendum)
Patient informed, voiced understanding.  Sent Bactrim to [facility] as requested  ----- Message from Vanna Scotland, MD sent at 07/17/2021 12:24 PM EST ----- Urine culture ended up growing Morganella.  Please treat with Bactrim DS twice daily for 5 days.  Vanna Scotland, MD

## 2021-10-04 ENCOUNTER — Ambulatory Visit: Payer: Self-pay | Admitting: Urology

## 2021-10-04 ENCOUNTER — Ambulatory Visit: Payer: Self-pay | Admitting: Physician Assistant

## 2021-12-21 ENCOUNTER — Ambulatory Visit (LOCAL_COMMUNITY_HEALTH_CENTER): Payer: 59

## 2021-12-21 DIAGNOSIS — Z23 Encounter for immunization: Secondary | ICD-10-CM

## 2021-12-21 DIAGNOSIS — Z719 Counseling, unspecified: Secondary | ICD-10-CM

## 2021-12-21 NOTE — Progress Notes (Signed)
Pt came in for immunization required for Immigration. Administered 4 vaccines, tolerated well. Gave VIS and NCIR copies. M.Karlin Binion,LPN

## 2022-01-22 ENCOUNTER — Ambulatory Visit (LOCAL_COMMUNITY_HEALTH_CENTER): Payer: 59

## 2022-01-22 DIAGNOSIS — Z719 Counseling, unspecified: Secondary | ICD-10-CM

## 2022-01-22 DIAGNOSIS — Z23 Encounter for immunization: Secondary | ICD-10-CM | POA: Diagnosis not present

## 2022-01-22 NOTE — Progress Notes (Signed)
Patient seen in nurse clinic with daughter.  Daughter understands English.  Interpreter Juan # [phone] with [employer] used. Hepatitis B administered intramuscularly in left deltoid and MMR subcutaneously in left arm.  Tdap administered intramuscularly in right deltoid and Varicella subcutaneously in right arm.  Tolerated well.  VIS provided for each vaccine.  NCIR updated and 2 copies provided to patient.  Explained about return for 3rd Tdap.

## 2022-05-02 ENCOUNTER — Encounter: Payer: Self-pay | Admitting: Primary Care

## 2022-05-03 ENCOUNTER — Other Ambulatory Visit: Payer: Self-pay

## 2022-05-03 DIAGNOSIS — Z1231 Encounter for screening mammogram for malignant neoplasm of breast: Secondary | ICD-10-CM

## 2022-05-08 ENCOUNTER — Ambulatory Visit: Payer: Self-pay | Attending: Hematology and Oncology | Admitting: Hematology and Oncology

## 2022-05-08 ENCOUNTER — Ambulatory Visit
Admission: RE | Admit: 2022-05-08 | Discharge: 2022-05-08 | Disposition: A | Discharge status: Home / Self Care | Payer: Self-pay | Source: Ambulatory Visit | Attending: Obstetrics and Gynecology | Admitting: Obstetrics and Gynecology

## 2022-05-08 VITALS — BP 137/81 | Wt 164.2 lb

## 2022-05-08 DIAGNOSIS — Z1231 Encounter for screening mammogram for malignant neoplasm of breast: Secondary | ICD-10-CM | POA: Insufficient documentation

## 2022-05-08 NOTE — Patient Instructions (Signed)
Taught Jasmine Henderson about breast self awareness. Patient did not need a Pap smear today due to last Pap smear was in 2022 per patient. Let her know [facility] will cover Pap smears every 5 years unless has a history of abnormal Pap smears. Her next Pap is due in 2027. [facility]. Appointment scheduled for 05/08/22. Patient aware of appointment and will be there. Let patient know will follow up with her within the next couple weeks with results. [name] verbalized understanding.  Pascal Lux, NP 12:59 PM

## 2022-05-08 NOTE — Progress Notes (Signed)
Ms. Jasmine Henderson is a 45 y.o. female who presents to [facility] clinic today with no complaints.    Pap Smear: Pap not smear completed today. Last Pap smear was 2022 at [facility] clinic and was normal. Per patient has history of an abnormal Pap smear. Last Pap smear result is not available in Epic. 2016 ASCUS/ HPV+; 3 normal Pap smears since then.    Physical exam: Breasts Breasts symmetrical. No skin abnormalities bilateral breasts. No nipple retraction bilateral breasts. No nipple discharge bilateral breasts. No lymphadenopathy. No lumps palpated bilateral breasts.        Pelvic/Bimanual Pap is not indicated today    Smoking History: Patient has never smoked and was not referred to quit line.    Patient Navigation: Patient education provided. Access to services provided for patient through [facility] program. Str[place] Jonetta Speak [phone]) interpreter provided. No transportation provided   Colorectal Cancer Screening: Per patient has never had colonoscopy completed No complaints today. Plans to complete colonoscopy.   Breast and Cervical Cancer Risk Assessment: Patient does not have family history of breast cancer, known genetic mutations, or radiation treatment to the chest before age 59. Patient has history of cervical dysplasia, immunocompromised, or DES exposure in-utero.  Risk Assessment   No risk assessment data     A: [facility] exam without pap smear No complaints with benign exam.  P: [facility]. Appointment scheduled 05/08/22.  Pascal Lux, NP 05/08/2022 12:58 PM

## 2022-07-09 ENCOUNTER — Ambulatory Visit: Payer: Self-pay

## 2022-07-11 ENCOUNTER — Other Ambulatory Visit: Payer: Self-pay | Admitting: Family Medicine

## 2022-07-11 DIAGNOSIS — N200 Calculus of kidney: Secondary | ICD-10-CM

## 2022-07-11 NOTE — Progress Notes (Signed)
07/15/22 5:46 PM   Allyne Gee 1976/06/12 [id number]  Referring provider:  Center, [place],  [place] [address]  Urological history: 1. rUTI's -Contributing factors of age and vaginal atrophy -Documented urine cultures over the last year  July 11, 2021 Morganella morganii  2.  Nephrolithiasis -Stone composition of 30% magnesium ammonium phosphate, 52% calcium phosphate, 3% calcium oxalate monohydrate and 15% ammonium urate -Right PCNL (2019) -Right URS (2019) -Right URS (2022)   3. Left ureterocele/chronic hydronephrosis  HPI: Rolando Whitby is a 47 y.o.female who presents today for a one year follow up with interpreter, Minerva Fester.   She has been experiencing urgency and urge incontinence over the last year.  She sometimes wears pads especially when going out to protect yourself from leaking.  She also states that last week she was experiencing some dysuria.  She states about a month ago she passed a stone.  Patient denies any modifying or aggravating factors.  Patient denies any gross hematuria, dysuria or suprapubic/flank pain.  Patient denies any fevers, chills, nausea or vomiting.    UA yellow slightly cloudy, specific gravity 1.010, 1+ blood, pH 7.0, leukocyte +1, 11-30 WBCs, 3-10 RBCs, greater than 10 epithelial cells and many bacteria.  KUB several right renal stones noted with one of the largest approximately 4 mm in size  PMH: Past Medical History:  Diagnosis Date   History of kidney stones    Hypothyroidism    Pre-diabetes    Seizures ([place])    pt states she had dizzy spell, passed out and was seizing. unknown reason. NO treatment. neurologist could not find anything   Thyroid disease     Surgical History: Past Surgical History:  Procedure Laterality Date   CARDIAC CATHETERIZATION     pt denies 06/01/21   CYSTOSCOPY/URETEROSCOPY/HOLMIUM LASER/STENT PLACEMENT Left 09/24/2017   Procedure:  CYSTOSCOPY/URETEROSCOPY/left retrorade pyleogram;  Surgeon: Abbie Sons, MD;  Location: [facility];  Service: Urology;  Laterality: Left;   CYSTOSCOPY/URETEROSCOPY/HOLMIUM LASER/STENT PLACEMENT Right 12/18/2017   Procedure: CYSTOSCOPY/URETEROSCOPY/HOLMIUM LASER/STENT Exchange;  Surgeon: Hollice Espy, MD;  Location: [facility];  Service: Urology;  Laterality: Right;   CYSTOSCOPY/URETEROSCOPY/HOLMIUM LASER/STENT PLACEMENT Right 06/08/2021   Procedure: CYSTOSCOPY/URETEROSCOPY/HOLMIUM LASER/STENT PLACEMENT;  Surgeon: Hollice Espy, MD;  Location: [facility];  Service: Urology;  Laterality: Right;   IR NEPHROSTOMY PLACEMENT RIGHT  11/25/2017   NEPHROLITHOTOMY Right 11/25/2017   Procedure: NEPHROLITHOTOMY PERCUTANEOUS;  Surgeon: Hollice Espy, MD;  Location: [facility];  Service: Urology;  Laterality: Right;    Home Medications:  Allergies as of 07/12/2022   No Known Allergies      Medication List        Accurate as of July 12, 2022 11:59 PM. If you have any questions, ask your nurse or doctor.          STOP taking these medications    sulfamethoxazole-trimethoprim 800-160 MG tablet Commonly known as: BACTRIM DS Stopped by: Domino Holten, PA-C   tamsulosin 0.4 MG Caps capsule Commonly known as: FLOMAX Stopped by: Zara Council, PA-C       TAKE these medications    Gemtesa 75 MG Tabs Generic drug: Vibegron Take 1 tablet (75 mg total) by mouth daily. Started by: Zara Council, PA-C   levothyroxine 100 MCG tablet Commonly known as: SYNTHROID Take 100 mcg by mouth daily before breakfast.        Allergies: No Known Allergies  Family History: Family History  Problem Relation Age of Onset   Bladder Cancer  Neg Hx    Kidney cancer Neg Hx     Social History:  reports that she has never smoked. She has never used smokeless tobacco. She reports that she does not drink alcohol and does not use drugs.   Physical Exam: BP 136/89   Pulse 80   Ht 5\' 2"  (1.575  m)   Wt 160 lb (72.6 kg)   BMI 29.26 kg/m   Constitutional:  Well nourished. Alert and oriented, No acute distress. HEENT: [place] AT, moist mucus membranes.  Trachea midline Cardiovascular: No clubbing, cyanosis, or edema. Respiratory: Normal respiratory effort, no increased work of breathing. Neurologic: Grossly intact, no focal deficits, moving all 4 extremities. Psychiatric: Normal mood and affect.    Laboratory Data: Urinalysis See HPI and EPIC I have reviewed the labs.    Pertinent Imaging: Narrative & Impression  CLINICAL DATA:  Right kidney stone   EXAM: ABDOMEN - 1 VIEW   COMPARISON:  03/29/2021.   FINDINGS: The bowel gas pattern is normal. Several 2-3 mm sized stones overlie the right kidney lower pole. The largest stone seen previously is not identified. No organomegaly.   IMPRESSION: Right-sided nephrolithiasis.     Electronically Signed   By: Sammie Bench M.D.   On: 07/13/2022 19:20  I have independently reviewed the films.  See HPI.    Assessment & Plan:    1.  Nephrolithiasis -Discussed that since she continues to pass stones and currently has stones on x-ray, I recommended that she undergo [id number] metabolic workup to see what is contributing to her stone formation -She is agreeable and orders put into Litholink  2.  Urgency/urge incontinence -It was difficult to tease out whether or not this urgency and urge incontinence was happening at or frequent enough interval for her to consider pursuing treatment, but eventually we were able to come to an agreement that it was interrupting her activities and she would like to start a medication to see if it would reduce her symptoms -Gemtesa 75 mg, #42, samples given  3. Microscopic hematuria -UA w/ 3-10 RBC's -urine culture pending -may be secondary to stones or UTI -will continue to monitor   Return in about 6 weeks (around 08/23/2022) for PVR and OAB questionnaire.   Raimundo Corbit, [place] [address] [place], [place] [address] [phone]

## 2022-07-12 ENCOUNTER — Encounter: Payer: Self-pay | Admitting: Urology

## 2022-07-12 ENCOUNTER — Ambulatory Visit
Admission: RE | Admit: 2022-07-12 | Discharge: 2022-07-12 | Disposition: A | Discharge status: Home / Self Care | Payer: No Typology Code available for payment source | Attending: Urology | Admitting: Urology

## 2022-07-12 ENCOUNTER — Ambulatory Visit
Admission: RE | Admit: 2022-07-12 | Discharge: 2022-07-12 | Disposition: A | Discharge status: Home / Self Care | Payer: No Typology Code available for payment source | Source: Ambulatory Visit | Attending: Urology | Admitting: Urology

## 2022-07-12 ENCOUNTER — Ambulatory Visit (INDEPENDENT_AMBULATORY_CARE_PROVIDER_SITE_OTHER): Payer: No Typology Code available for payment source | Admitting: Urology

## 2022-07-12 VITALS — BP 136/89 | HR 80 | Ht 62.0 in | Wt 160.0 lb

## 2022-07-12 DIAGNOSIS — R3915 Urgency of urination: Secondary | ICD-10-CM | POA: Diagnosis not present

## 2022-07-12 DIAGNOSIS — N200 Calculus of kidney: Secondary | ICD-10-CM

## 2022-07-12 DIAGNOSIS — R3129 Other microscopic hematuria: Secondary | ICD-10-CM

## 2022-07-12 LAB — MICROSCOPIC EXAMINATION: Epithelial Cells (non renal): >10 /hpf — AB (ref 0–10)

## 2022-07-12 LAB — URINALYSIS, COMPLETE
Bilirubin, UA: NEGATIVE
Glucose, UA: NEGATIVE
Ketones, UA: NEGATIVE
Nitrite, UA: NEGATIVE
Protein,UA: NEGATIVE
Specific Gravity, UA: 1.01 (ref 1.005–1.030)
Urobilinogen, Ur: 0.2 mg/dL (ref 0.2–1.0)
pH, UA: 7 (ref 5.0–7.5)

## 2022-07-12 MED ORDER — GEMTESA 75 MG PO TABS: 75.0000 mg | ORAL_TABLET | Freq: Every day | ORAL | 0 refills | Status: DC

## 2022-07-12 NOTE — Patient Instructions (Signed)

## 2022-07-19 LAB — CULTURE, URINE COMPREHENSIVE

## 2022-07-20 ENCOUNTER — Telehealth: Payer: Self-pay | Admitting: Family Medicine

## 2022-07-20 MED ORDER — SULFAMETHOXAZOLE-TRIMETHOPRIM 800-160 MG PO TABS: 1.0000 | ORAL_TABLET | Freq: Two times a day (BID) | ORAL | 0 refills | Status: DC

## 2022-07-20 NOTE — Telephone Encounter (Signed)
Patient notified and voiced understanding. ABX sent to pharmacy.  

## 2022-07-20 NOTE — Telephone Encounter (Signed)
-----   Message from Nori Riis, PA-C sent at 07/19/2022  1:44 PM EST ----- Please let Mrs. Avilez-Martinez know that her urine culture was positive for infection.  I like for her to start Septra DS twice daily for 7 days.

## 2022-08-13 ENCOUNTER — Telehealth: Payer: Self-pay | Admitting: Urology

## 2022-08-13 DIAGNOSIS — N200 Calculus of kidney: Secondary | ICD-10-CM

## 2022-08-13 NOTE — Telephone Encounter (Signed)
Patient notified kit was reordered and is to call back if she does not receive it in 1 week.

## 2022-08-13 NOTE — Telephone Encounter (Signed)
Pt never received her Litholink kit to do a 24 hr urine

## 2022-08-23 ENCOUNTER — Ambulatory Visit: Payer: No Typology Code available for payment source | Admitting: Urology

## 2022-08-29 ENCOUNTER — Other Ambulatory Visit: Payer: No Typology Code available for payment source

## 2022-08-29 DIAGNOSIS — N200 Calculus of kidney: Secondary | ICD-10-CM

## 2022-08-30 ENCOUNTER — Ambulatory Visit (LOCAL_COMMUNITY_HEALTH_CENTER): Payer: No Typology Code available for payment source

## 2022-08-30 DIAGNOSIS — Z23 Encounter for immunization: Secondary | ICD-10-CM | POA: Diagnosis not present

## 2022-08-30 DIAGNOSIS — Z719 Counseling, unspecified: Secondary | ICD-10-CM

## 2022-08-30 LAB — LITHOLINK SERUM PANEL
CO2: 24 mmol/L (ref 20–29)
Calcium: 9.6 mg/dL (ref 8.7–10.2)
Chloride: 98 mmol/L (ref 96–106)
Creatinine, Ser: 0.63 mg/dL (ref 0.57–1.00)
Magnesium: 1.9 mg/dL (ref 1.6–2.3)
Phosphorus: 3.1 mg/dL (ref 3.0–4.3)
Potassium: 4.1 mmol/L (ref 3.5–5.2)
Sodium: 139 mmol/L (ref 134–144)
Uric Acid: 5.2 mg/dL (ref 2.6–6.2)
eGFR: 111 mL/min/{1.73_m2} (ref >59)

## 2022-08-30 NOTE — Progress Notes (Signed)
In nurse clinic for Tdap #3. Tolerated vaccine well. Updated [facility] copy given and explained. Lang line [phone] interpreter today. Josie Saunders, RN

## 2022-09-04 LAB — LITHOLINK 24HR URINE PANEL
Ammonium, Urine: 104 mmol/24 hr — ABNORMAL HIGH (ref 15–60)
Calcium Oxalate Saturation: 3.42 — ABNORMAL LOW (ref 6.00–10.00)
Calcium Phosphate Saturation: 1.51 (ref 0.50–2.00)
Calcium, Urine: 211 mg/24 hr — ABNORMAL HIGH (ref <200)
Calcium/Creatinine Ratio: 131 mg/g creat (ref 51–262)
Calcium/Kg Body Weight: 2.9 mg/24 hr/kg (ref <4.0)
Chloride, Urine: 246 mmol/24 hr (ref 70–250)
Citrate, Urine: 1163 mg/24 hr (ref >550)
Creatinine, Urine: 1614 mg/24 hr
Creatinine/Kg Body Weight: 22.2 mg/24 hr/kg — ABNORMAL HIGH (ref 8.7–20.3)
Cystine, Urine, Qualitative: NEGATIVE
Magnesium, Urine: 161 mg/24 hr — ABNORMAL HIGH (ref 30–120)
Oxalate, Urine: 55 mg/24 hr — ABNORMAL HIGH (ref 20–40)
Phosphorus, Urine: 1332 mg/24 hr — ABNORMAL HIGH (ref 600–1200)
Potassium, Urine: 140 mmol/24 hr — ABNORMAL HIGH (ref 20–100)
Protein Catabolic Rate: 1.5 g/kg/24 hr — ABNORMAL HIGH (ref 0.8–1.4)
Sodium, Urine: 218 mmol/24 hr — ABNORMAL HIGH (ref 50–150)
Sulfate, Urine: 66 meq/24 hr (ref 20–80)
Urea Nitrogen, Urine: 14.68 g/24 hr — ABNORMAL HIGH (ref 6.00–14.00)
Uric Acid Saturation: 0.05 (ref <1.00)
Uric Acid, Urine: 1144 mg/24 hr — ABNORMAL HIGH (ref <750)
Urine Volume (Preserved): 3300 mL/24 hr (ref 500–4000)
pH, 24 hr, Urine: 7.238 — ABNORMAL HIGH (ref 5.800–6.200)

## 2022-09-10 NOTE — Progress Notes (Signed)
09/12/22 12:58 PM   Jasmine Henderson Jul 20, 1975 [id number]  Referring provider:  Freddy Finner, NP [place],  [place] [address]  Urological history: 1. rUTI's -Contributing factors of age and vaginal atrophy -Documented urine cultures over the last year  July 11, 2021 Morganella morganii  2.  Nephrolithiasis -Stone composition of 30% magnesium ammonium phosphate, 52% calcium phosphate, 3% calcium oxalate monohydrate and 15% ammonium urate -Right PCNL (2019) -Right URS (2019) -Right URS (2022)   3. Left ureterocele/chronic hydronephrosis  HPI: Jasmine Henderson is a 47 y.o.female who presents today for a discussion of her metabolic work up with interpreter, Leola Brazil.  Litholink completed on August 29, 2022 revealed that her urine calcium is borderline elevated, hyperoxaluria, high urine pH, severe hyperuricosuria and mild calcium phosphate stone risk.  PMH: Past Medical History:  Diagnosis Date   History of kidney stones    Hypothyroidism    Pre-diabetes    Seizures ([place])    pt states she had dizzy spell, passed out and was seizing. unknown reason. NO treatment. neurologist could not find anything   Thyroid disease     Surgical History: Past Surgical History:  Procedure Laterality Date   CARDIAC CATHETERIZATION     pt denies 06/01/21   CYSTOSCOPY/URETEROSCOPY/HOLMIUM LASER/STENT PLACEMENT Left 09/24/2017   Procedure: CYSTOSCOPY/URETEROSCOPY/left retrorade pyleogram;  Surgeon: Abbie Sons, MD;  Location: [facility];  Service: Urology;  Laterality: Left;   CYSTOSCOPY/URETEROSCOPY/HOLMIUM LASER/STENT PLACEMENT Right 12/18/2017   Procedure: CYSTOSCOPY/URETEROSCOPY/HOLMIUM LASER/STENT Exchange;  Surgeon: Hollice Espy, MD;  Location: [facility];  Service: Urology;  Laterality: Right;   CYSTOSCOPY/URETEROSCOPY/HOLMIUM LASER/STENT PLACEMENT Right 06/08/2021   Procedure: CYSTOSCOPY/URETEROSCOPY/HOLMIUM LASER/STENT PLACEMENT;  Surgeon:  Hollice Espy, MD;  Location: [facility];  Service: Urology;  Laterality: Right;   IR NEPHROSTOMY PLACEMENT RIGHT  11/25/2017   NEPHROLITHOTOMY Right 11/25/2017   Procedure: NEPHROLITHOTOMY PERCUTANEOUS;  Surgeon: Hollice Espy, MD;  Location: [facility];  Service: Urology;  Laterality: Right;    Home Medications:  Allergies as of 09/11/2022   No Known Allergies      Medication List        Accurate as of September 11, 2022 11:59 PM. If you have any questions, ask your nurse or doctor.          STOP taking these medications    Gemtesa 75 MG Tabs Generic drug: Vibegron Stopped by: Gryffin Altice, PA-C   sulfamethoxazole-trimethoprim 800-160 MG tablet Commonly known as: BACTRIM DS Stopped by: Zara Council, PA-C       TAKE these medications    levothyroxine 100 MCG tablet Commonly known as: SYNTHROID Take 100 mcg by mouth daily before breakfast.        Allergies: No Known Allergies  Family History: Family History  Problem Relation Age of Onset   Bladder Cancer Neg Hx    Kidney cancer Neg Hx     Social History:  reports that she has never smoked. She has never used smokeless tobacco. She reports that she does not drink alcohol and does not use drugs.   Physical Exam: BP 127/80   Pulse 73   Ht 5\' 2"  (1.575 m)   Wt 160 lb 14.4 oz (73 kg)   LMP 08/20/2022 (Exact Date)   BMI 29.43 kg/m   Constitutional:  Well nourished. Alert and oriented, No acute distress. HEENT: [place] AT, moist mucus membranes.  Trachea midline, no masses. Cardiovascular: No clubbing, cyanosis, or edema. Respiratory: Normal respiratory effort, no increased work of breathing. Neurologic: Grossly intact, no focal  deficits, moving all 4 extremities. Psychiatric: Normal mood and affect.    Laboratory Data: Results for orders placed or performed in visit on 08/29/22  Litholink Serum Panel  Result Value Ref Range   Uric Acid 5.2 2.6 - 6.2 mg/dL   Creatinine, Ser 0.63 0.57 - 1.00 mg/dL    eGFR 111 >59 mL/min/1.73   Sodium 139 134 - 144 mmol/L   Potassium 4.1 3.5 - 5.2 mmol/L   Chloride 98 96 - 106 mmol/L   CO2 24 20 - 29 mmol/L   Calcium 9.6 8.7 - 10.2 mg/dL   Phosphorus 3.1 3.0 - 4.3 mg/dL   Magnesium 1.9 1.6 - 2.3 mg/dL    Component     Latest Ref Rng 08/29/2022  Cystine, Urine, Qualitative     Negative  Neg   Urine Volume (Preserved)     500 - 4,000 mL/24 hr 3,300   Calcium Oxalate Saturation     6.00 - 10.00  3.42 (L)   Calcium, Urine     <200 mg/24 hr 211 (H)   Oxalate, Urine     20 - 40 mg/24 hr 55 (H)   Citrate, Urine     >550 mg/24 hr 1,163   Calcium Phosphate Saturation     0.50 - 2.00  1.51   pH, 24 hr, Urine     5.800 - 6.200  7.238 (H)   Uric Acid Saturation     <1.00  0.05   Uric Acid, Urine     <750 mg/24 hr 1,144 (H)   Sodium, Urine     50 - 150 mmol/24 hr 218 (H)   Potassium, Urine     20 - 100 mmol/24 hr 140 (H)   Magnesium, Urine     30 - 120 mg/24 hr 161 (H)   Phosphorus, Urine     600 - 1,200 mg/24 hr 1,332 (H)   Ammonium, Urine     15 - 60 mmol/24 hr 104 (H)   Chloride, Urine     70 - 250 mmol/24 hr 246   Sulfate, Urine     20 - 80 meq/24 hr 66   Urea Nitrogen, Urine     6.00 - 14.00 g/24 hr 14.68 (H)   Protein Catabolic Rate     0.8 - 1.4 g/kg/24 hr 1.5 (H)   Creatinine, Ur [id number]     Not Applic. 0000000 hr [id number]   Creatinine/Kg Body Weight     8.7 - 20.3 mg/24 hr/kg 22.2 (H)   Calcium/Kg Body Weight     <4.0 mg/24 hr/kg 2.9   Calcium/Creatinine Ratio     51 - 262 mg/g creat 131   Comment Note     Legend: (L) Low (H) High I have reviewed the labs.    Pertinent Imaging: N/A  Assessment & Plan:    1.  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   Jasmine Henderson   [place] [address], [place], [place] [address] [phone]  I spent 30 minutes on the day of the encounter to include pre-visit record review, face-to-face time with the patient, and post-visit ordering of tests.

## 2022-09-11 ENCOUNTER — Encounter: Payer: Self-pay | Admitting: Urology

## 2022-09-11 ENCOUNTER — Ambulatory Visit: Payer: No Typology Code available for payment source | Admitting: Urology

## 2022-09-11 VITALS — BP 127/80 | HR 73 | Ht 62.0 in | Wt 160.9 lb

## 2022-09-11 DIAGNOSIS — Z87442 Personal history of urinary calculi: Secondary | ICD-10-CM

## 2022-09-11 DIAGNOSIS — N200 Calculus of kidney: Secondary | ICD-10-CM

## 2022-09-11 NOTE — Patient Instructions (Signed)

## 2022-10-25 ENCOUNTER — Other Ambulatory Visit: Payer: Self-pay | Admitting: Family Medicine

## 2022-10-25 DIAGNOSIS — N200 Calculus of kidney: Secondary | ICD-10-CM

## 2022-10-30 ENCOUNTER — Ambulatory Visit: Payer: No Typology Code available for payment source | Admitting: Urology

## 2022-11-27 ENCOUNTER — Other Ambulatory Visit: Payer: No Typology Code available for payment source

## 2023-08-17 ENCOUNTER — Emergency Department: Payer: Self-pay

## 2023-08-17 ENCOUNTER — Other Ambulatory Visit: Payer: Self-pay

## 2023-08-17 DIAGNOSIS — D72829 Elevated white blood cell count, unspecified: Secondary | ICD-10-CM | POA: Insufficient documentation

## 2023-08-17 DIAGNOSIS — N132 Hydronephrosis with renal and ureteral calculous obstruction: Secondary | ICD-10-CM | POA: Insufficient documentation

## 2023-08-17 LAB — COMPREHENSIVE METABOLIC PANEL
ALT: 44 U/L (ref 0–44)
AST: 28 U/L (ref 15–41)
Albumin: 4.1 g/dL (ref 3.5–5.0)
Alkaline Phosphatase: 71 U/L (ref 38–126)
Anion gap: 9 (ref 5–15)
BUN: 21 mg/dL — ABNORMAL HIGH (ref 6–20)
CO2: 22 mmol/L (ref 22–32)
Calcium: 9 mg/dL (ref 8.9–10.3)
Chloride: 105 mmol/L (ref 98–111)
Creatinine, Ser: 0.62 mg/dL (ref 0.44–1.00)
GFR, Estimated: >60 mL/min (ref >60)
Glucose, Bld: 132 mg/dL — ABNORMAL HIGH (ref 70–99)
Potassium: 3.7 mmol/L (ref 3.5–5.1)
Sodium: 136 mmol/L (ref 135–145)
Total Bilirubin: 0.7 mg/dL (ref 0.0–1.2)
Total Protein: 7.7 g/dL (ref 6.5–8.1)

## 2023-08-17 LAB — URINALYSIS, ROUTINE W REFLEX MICROSCOPIC
Bilirubin Urine: NEGATIVE
Glucose, UA: NEGATIVE mg/dL
Ketones, ur: 20 mg/dL — AB
Nitrite: NEGATIVE
Protein, ur: 100 mg/dL — AB
Specific Gravity, Urine: 1.025 (ref 1.005–1.030)
WBC, UA: >50 WBC/hpf (ref 0–5)
pH: 5 (ref 5.0–8.0)

## 2023-08-17 LAB — CBC
HCT: 39.5 % (ref 36.0–46.0)
Hemoglobin: 13.2 g/dL (ref 12.0–15.0)
MCH: 29.7 pg (ref 26.0–34.0)
MCHC: 33.4 g/dL (ref 30.0–36.0)
MCV: 89 fL (ref 80.0–100.0)
Platelets: 273 10*3/uL (ref 150–400)
RBC: 4.44 MIL/uL (ref 3.87–5.11)
RDW: 13.1 % (ref 11.5–15.5)
WBC: 11.7 10*3/uL — ABNORMAL HIGH (ref 4.0–10.5)
nRBC: 0 % (ref 0.0–0.2)

## 2023-08-17 LAB — POC URINE PREG, ED: Preg Test, Ur: NEGATIVE

## 2023-08-17 NOTE — ED Triage Notes (Signed)
 Pt arrives with c/o right sided flank pain that started today. Pt endorses n/v. Pt has hx of kidney stones.

## 2023-08-18 ENCOUNTER — Emergency Department
Admission: EM | Admit: 2023-08-18 | Discharge: 2023-08-18 | Disposition: A | Discharge status: Home / Self Care | Payer: Self-pay | Attending: Emergency Medicine | Admitting: Emergency Medicine

## 2023-08-18 DIAGNOSIS — N200 Calculus of kidney: Secondary | ICD-10-CM

## 2023-08-18 MED ORDER — TAMSULOSIN HCL 0.4 MG PO CAPS: 0.4000 mg | ORAL_CAPSULE | Freq: Every day | ORAL | 0 refills | Status: DC

## 2023-08-18 MED ORDER — KETOROLAC TROMETHAMINE 15 MG/ML IJ SOLN
15.0000 mg | Freq: Once | INTRAMUSCULAR | Status: AC
  Administered 2023-08-18: 15 mg via INTRAVENOUS
  Filled 2023-08-18: qty 1

## 2023-08-18 MED ORDER — ONDANSETRON HCL 4 MG/2ML IJ SOLN
4.0000 mg | Freq: Once | INTRAMUSCULAR | Status: AC
  Administered 2023-08-18: 4 mg via INTRAVENOUS
  Filled 2023-08-18: qty 2

## 2023-08-18 MED ORDER — SODIUM CHLORIDE 0.9 % IV SOLN
2.0000 g | Freq: Once | INTRAVENOUS | Status: AC
  Administered 2023-08-18: 2 g via INTRAVENOUS
  Filled 2023-08-18: qty 20

## 2023-08-18 MED ORDER — IBUPROFEN 600 MG PO TABS: 600.0000 mg | ORAL_TABLET | Freq: Four times a day (QID) | ORAL | 0 refills | Status: AC | PRN

## 2023-08-18 MED ORDER — OXYCODONE HCL 5 MG PO TABS: 5.0000 mg | ORAL_TABLET | Freq: Four times a day (QID) | ORAL | 0 refills | Status: DC | PRN

## 2023-08-18 MED ORDER — SODIUM CHLORIDE 0.9 % IV BOLUS
1000.0000 mL | Freq: Once | INTRAVENOUS | Status: AC
Start: 2023-08-18 — End: 2023-08-18
  Administered 2023-08-18: 1000 mL via INTRAVENOUS

## 2023-08-18 MED ORDER — ONDANSETRON 4 MG PO TBDP: 4.0000 mg | ORAL_TABLET | Freq: Three times a day (TID) | ORAL | 0 refills | Status: AC | PRN

## 2023-08-18 MED ORDER — SULFAMETHOXAZOLE-TRIMETHOPRIM 800-160 MG PO TABS: 1.0000 | ORAL_TABLET | Freq: Two times a day (BID) | ORAL | 0 refills | Status: DC

## 2023-08-18 NOTE — ED Provider Notes (Signed)
 [facility] Provider Note    Event Date/Time   First MD Initiated Contact with Patient 08/18/23 0040     (approximate)   History   Flank Pain   HPI  Jasmine Henderson is a 48 y.o. female with history of seizures, kidney stones who comes in with concern for flank pain.  Patient has had a right sided flank pain that started today.  Patient reports pain is severe, associated with some nausea, vomiting.  No known fevers.  Has not taken Tylenol in over 12 hours.  Does report having kidney stones previously that have needed stents, lithotripsy.   Physical Exam   Triage Vital Signs: ED Triage Vitals  Encounter Vitals Group     BP 08/17/23 1933 (!) 166/91     Systolic BP Percentile --      Diastolic BP Percentile --      Pulse Rate 08/17/23 1933 68     Resp 08/17/23 1933 (!) 22     Temp 08/17/23 1933 98.1 F (36.7 C)     Temp Source 08/17/23 1933 Oral     SpO2 08/17/23 1933 100 %     Weight 08/17/23 1931 150 lb (68 kg)     Height --      Head Circumference --      Peak Flow --      Pain Score 08/17/23 1931 10     Pain Loc --      Pain Education --      Exclude from Growth Chart --     Most recent vital signs: Vitals:   08/17/23 1933  BP: (!) 166/91  Pulse: 68  Resp: (!) 22  Temp: 98.1 F (36.7 C)  SpO2: 100%     General: Awake, no distress.  CV:  Good peripheral perfusion.  Resp:  Normal effort.  Abd:  No distention.  Tender on the right side Other:     ED Results / Procedures / Treatments   Labs (all labs ordered are listed, but only abnormal results are displayed) Labs Reviewed  COMPREHENSIVE METABOLIC PANEL - Abnormal; Notable for the following components:      Result Value   Glucose, Bld 132 (*)    BUN 21 (*)    All other components within normal limits  CBC - Abnormal; Notable for the following components:   WBC 11.7 (*)    All other components within normal limits  URINALYSIS, ROUTINE W REFLEX MICROSCOPIC -  Abnormal; Notable for the following components:   Color, Urine YELLOW (*)    APPearance CLOUDY (*)    Hgb urine dipstick LARGE (*)    Ketones, ur 20 (*)    Protein, ur 100 (*)    Leukocytes,Ua MODERATE (*)    Bacteria, UA RARE (*)    All other components within normal limits  POC URINE PREG, ED      RADIOLOGY I have reviewed the CT personally interpreted positive kidney stone PROCEDURES:  Critical Care performed: No  Procedures   MEDICATIONS ORDERED IN ED: Medications  cefTRIAXone (ROCEPHIN) 2 g in sodium chloride 0.9 % 100 mL IVPB (2 g Intravenous New Bag/Given 08/18/23 0117)  sodium chloride 0.9 % bolus 1,000 mL (1,000 mLs Intravenous New Bag/Given 08/18/23 0115)  ketorolac (TORADOL) 15 MG/ML injection 15 mg (15 mg Intravenous Given 08/18/23 0117)  ondansetron (ZOFRAN) injection 4 mg (4 mg Intravenous Given 08/18/23 0118)     IMPRESSION / MDM / ASSESSMENT AND PLAN / ED COURSE  I  reviewed the triage vital signs and the nursing notes.   Patient's presentation is most consistent with acute presentation with potential threat to life or bodily function.   Differential is kidney stone, appendicitis, UTI, pyelonephritis  Pregnancy test was negative.  CMP shows creatinine of 0.6.  CBC shows elevated white count of 11.7.  Urine has some squamous cells and a and some whites and RBCs.  IMPRESSION: 9 mm right mid ureteral stone with hydronephrosis and hydroureter.   Scattered small nonobstructing renal calculi are noted on the right measuring up to 3 mm.   No other focal abnormality is seen.   1:11 AM discussed with Dr. Apolinar Junes  from urology given the urine with significant WBCs in it as well as the elevated white count.  Patient is not febrile here I rechecked it and she has not had any fever reducers.  Per Dr. Apolinar Junes this is most likely contaminant given the squamous cells but did recommend antibiotics to be cautious and to send for urine culture.  Discussed return precautions  with patient and they will follow her up in clinic.  Patient will be treated symptomatically.  2:45 AM repeat evaluation patient reports resolution of symptoms.  She states that she is not sure if she might of passed the stone.  We discussed however continuing following up with urology and plan for antibiotics in case there is a overlying UTI.  I did discuss with patient not using ibuprofen after Tuesday in case she did not pass the stone needs lithotripsy.  We also discussed returning for fevers as this is a sign of an emergency would need stent placement.  They expressed understanding  Spanish interpreter was used   FINAL CLINICAL IMPRESSION(S) / ED DIAGNOSES   Final diagnoses:  Kidney stone     Rx / DC Orders   ED Discharge Orders          Ordered    oxyCODONE (ROXICODONE) 5 MG immediate release tablet  Every 6 hours PRN        08/18/23 0247    ibuprofen (ADVIL) 600 MG tablet  Every 6 hours PRN        08/18/23 0247    ondansetron (ZOFRAN-ODT) 4 MG disintegrating tablet  Every 8 hours PRN        08/18/23 0247    tamsulosin (FLOMAX) 0.4 MG CAPS capsule  Daily        08/18/23 0247    sulfamethoxazole-trimethoprim (BACTRIM DS) 800-160 MG tablet  2 times daily        08/18/23 0247             Note:  This document was prepared using Dragon voice recognition software and may include unintentional dictation errors.   Concha Se, MD 08/18/23 [phone]

## 2023-08-18 NOTE — Discharge Instructions (Addendum)
 You have a kidney stone. See report below.   Take ibuprofen 600mg  every 8 hours daily (as long as you are not on any other blood thinners or have kidney disease) Take tylenol 1g every 8 hours daily. Take oxycodone for breakthrough pain. Do not drive, work, or operate machinery while on this.  Take zofran to help with nausea. Take Flomax to help dilate uretha. Call urology number above to schedule outpatient appointment. Return to ED for fevers, unable to keep food down, or any other concerns.   MPRESSION: 9 mm right mid ureteral stone with hydronephrosis and hydroureter.   Scattered small nonobstructing renal calculi are noted on the right measuring up to 3 mm.   No other focal abnormality is seen.   Take oxycodone as prescribed. Do not drink alcohol, drive or participate in any other potentially dangerous activities while taking this medication as it may make you sleepy. Do not take this medication with any other sedating medications, either prescription or over-the-counter. If you were prescribed Percocet or Vicodin, do not take these with acetaminophen (Tylenol) as it is already contained within these medications.  This medication is an opiate (or narcotic) pain medication and can be habit forming. Use it as little as possible to achieve adequate pain control. Do not use or use it with extreme caution if you have a history of opiate abuse or dependence. If you are on a pain contract with your primary care doctor or a pain specialist, be sure to let them know you were prescribed this medication today from the Emergency Department. This medication is intended for your use only - do not give any to anyone else and keep it in a secure place where nobody else, especially children, have access to it.

## 2023-08-19 ENCOUNTER — Other Ambulatory Visit: Payer: Self-pay | Admitting: Urology

## 2023-08-19 DIAGNOSIS — N200 Calculus of kidney: Secondary | ICD-10-CM

## 2023-08-19 LAB — URINE CULTURE

## 2023-08-19 NOTE — Progress Notes (Signed)
 08/20/23 11:28 AM   Jasmine Henderson September 23, 1975 [id number]  Referring provider:  Sandrea Hughs, NP [address] [place],  [place] [address]  Urological history: 1. rUTI's -Contributing factors of age and vaginal atrophy -Documented urine cultures over the last year  August 17, 2023, multiple species present  2.  Nephrolithiasis -Stone composition of 30% magnesium ammonium phosphate, 52% calcium phosphate, 3% calcium oxalate monohydrate and 15% ammonium urate -Right PCNL (2019) -Right URS (2019) -Right URS (2022)   3. Left ureterocele/chronic hydronephrosis  Chief Complaint  Patient presents with   Nephrolithiasis    HPI: Jasmine Henderson is a 48 y.o.female who presents today for ED follow up with interpreter, Milly and her son, Judie Grieve.   Previous records reviewed.     She presented to the emergency room on August 18, 2023 for the sudden onset of right-sided flank pain.  She was having some nausea and vomiting with the flank pain.  Her CMP demonstrated creatinine of 0.6.  CBC with elevated white count of 11.7.  UA with some squames and some leuks and RBCs.  CT renal stone study noted a 9 mm right mid ureteral stone with hydronephrosis and hydroureter  KUB right mid ureteral stone not identified  UA yellow cloudy, 1+ ketone, specific gravity 1.025, 3+ heme, pH 6.0, 2+ protein, 1+ leukocyte, greater than 30 WBCs, greater than 30 RBCs, greater than 10 squames, mucus threads are present and many bacteria.   PMH: Past Medical History:  Diagnosis Date   History of kidney stones    Hypothyroidism    Pre-diabetes    Seizures (HCC)    pt states she had dizzy spell, passed out and was seizing. unknown reason. NO treatment. neurologist could not find anything   Thyroid disease     Surgical History: Past Surgical History:  Procedure Laterality Date   CARDIAC CATHETERIZATION     pt denies 06/01/21   CYSTOSCOPY/URETEROSCOPY/HOLMIUM LASER/STENT PLACEMENT Left  09/24/2017   Procedure: CYSTOSCOPY/URETEROSCOPY/left retrorade pyleogram;  Surgeon: Riki Altes, MD;  Location: [facility];  Service: Urology;  Laterality: Left;   CYSTOSCOPY/URETEROSCOPY/HOLMIUM LASER/STENT PLACEMENT Right 12/18/2017   Procedure: CYSTOSCOPY/URETEROSCOPY/HOLMIUM LASER/STENT Exchange;  Surgeon: Vanna Scotland, MD;  Location: [facility];  Service: Urology;  Laterality: Right;   CYSTOSCOPY/URETEROSCOPY/HOLMIUM LASER/STENT PLACEMENT Right 06/08/2021   Procedure: CYSTOSCOPY/URETEROSCOPY/HOLMIUM LASER/STENT PLACEMENT;  Surgeon: Vanna Scotland, MD;  Location: [facility];  Service: Urology;  Laterality: Right;   IR NEPHROSTOMY PLACEMENT RIGHT  11/25/2017   NEPHROLITHOTOMY Right 11/25/2017   Procedure: NEPHROLITHOTOMY PERCUTANEOUS;  Surgeon: Vanna Scotland, MD;  Location: [facility];  Service: Urology;  Laterality: Right;    Home Medications:  Allergies as of 08/20/2023   No Known Allergies      Medication List        Accurate as of August 20, 2023 11:28 AM. If you have any questions, ask your nurse or doctor.          ibuprofen 600 MG tablet Commonly known as: ADVIL Take 1 tablet (600 mg total) by mouth every 6 (six) hours as needed for up to 7 days.   levothyroxine 100 MCG tablet Commonly known as: SYNTHROID Take 100 mcg by mouth daily before breakfast.   ondansetron 4 MG disintegrating tablet Commonly known as: ZOFRAN-ODT Take 1 tablet (4 mg total) by mouth every 8 (eight) hours as needed for up to 5 days.   oxyCODONE 5 MG immediate release tablet Commonly known as: Roxicodone Take 1 tablet (5 mg total) by mouth every 6 (six)  hours as needed for up to 5 days.   sulfamethoxazole-trimethoprim 800-160 MG tablet Commonly known as: BACTRIM DS Take 1 tablet by mouth 2 (two) times daily for 7 days.   tamsulosin 0.4 MG Caps capsule Commonly known as: FLOMAX Take 1 capsule (0.4 mg total) by mouth daily.        Allergies: No Known Allergies  Family  History: Family History  Problem Relation Age of Onset   Bladder Cancer Neg Hx    Kidney cancer Neg Hx     Social History:  reports that she has never smoked. She has never used smokeless tobacco. She reports that she does not drink alcohol and does not use drugs.   Physical Exam: BP 117/81   Pulse 93   Temp 98 F (36.7 C) (Oral)   Ht 5\' 2"  (1.575 m)   Wt 159 lb (72.1 kg)   BMI 29.08 kg/m   Constitutional:  Well nourished. Alert and oriented, No acute distress. HEENT: [place] AT, moist mucus membranes.  Trachea midline Cardiovascular: No clubbing, cyanosis, or edema. Respiratory: Normal respiratory effort, no increased work of breathing. Neurologic: Grossly intact, no focal deficits, moving all 4 extremities. Psychiatric: Normal mood and affect.    Laboratory Data: Results for orders placed or performed during the hospital encounter of 08/18/23  Urine Culture   Collection Time: 08/17/23  7:36 PM   Specimen: Urine, Clean Catch  Result Value Ref Range   Specimen Description      URINE, CLEAN CATCH Performed at [facility], [address] [place], [place] [address]    Special Requests      NONE Performed at [facility], [address] [place], [place] [address]    Culture MULTIPLE SPECIES PRESENT, SUGGEST RECOLLECTION (A)    Report Status 08/19/2023 FINAL   Comprehensive metabolic panel   Collection Time: 08/17/23  7:36 PM  Result Value Ref Range   Sodium 136 135 - 145 mmol/L   Potassium 3.7 3.5 - 5.1 mmol/L   Chloride 105 98 - 111 mmol/L   CO2 22 22 - 32 mmol/L   Glucose, Bld 132 (H) 70 - 99 mg/dL   BUN 21 (H) 6 - 20 mg/dL   Creatinine, Ser 0.98 0.44 - 1.00 mg/dL   Calcium 9.0 8.9 - 11.9 mg/dL   Total Protein 7.7 6.5 - 8.1 g/dL   Albumin 4.1 3.5 - 5.0 g/dL   AST 28 15 - 41 U/L   ALT 44 0 - 44 U/L   Alkaline Phosphatase 71 38 - 126 U/L   Total Bilirubin 0.7 0.0 - 1.2 mg/dL   GFR, Estimated >14 >78 mL/min   Anion gap 9 5 - 15  CBC    Collection Time: 08/17/23  7:36 PM  Result Value Ref Range   WBC 11.7 (H) 4.0 - 10.5 K/uL   RBC 4.44 3.87 - 5.11 MIL/uL   Hemoglobin 13.2 12.0 - 15.0 g/dL   HCT 29.5 62.1 - 30.8 %   MCV 89.0 80.0 - 100.0 fL   MCH 29.7 26.0 - 34.0 pg   MCHC 33.4 30.0 - 36.0 g/dL   RDW 65.7 84.6 - 96.2 %   Platelets 273 150 - 400 K/uL   nRBC 0.0 0.0 - 0.2 %  Urinalysis, Routine w reflex microscopic -Urine, Clean Catch   Collection Time: 08/17/23  7:36 PM  Result Value Ref Range   Color, Urine YELLOW (A) YELLOW   APPearance CLOUDY (A) CLEAR   Specific Gravity, Urine 1.025  1.005 - 1.030   pH 5.0 5.0 - 8.0   Glucose, UA NEGATIVE NEGATIVE mg/dL   Hgb urine dipstick LARGE (A) NEGATIVE   Bilirubin Urine NEGATIVE NEGATIVE   Ketones, ur 20 (A) NEGATIVE mg/dL   Protein, ur 161 (A) NEGATIVE mg/dL   Nitrite NEGATIVE NEGATIVE   Leukocytes,Ua MODERATE (A) NEGATIVE   RBC / HPF 21-50 0 - 5 RBC/hpf   WBC, UA >50 0 - 5 WBC/hpf   Bacteria, UA RARE (A) NONE SEEN   Squamous Epithelial / HPF 11-20 0 - 5 /HPF   Mucus PRESENT   POC urine preg, ED   Collection Time: 08/17/23 10:58 PM  Result Value Ref Range   Preg Test, Ur Negative Negative    Urinalysis See EPIC and HPI I have reviewed the labs.    Pertinent Imaging: Narrative & Impression  CLINICAL DATA:  Right-sided flank pain for 1 day   EXAM: CT ABDOMEN AND PELVIS WITHOUT CONTRAST   TECHNIQUE: Multidetector CT imaging of the abdomen and pelvis was performed following the standard protocol without IV contrast.   RADIATION DOSE REDUCTION: This exam was performed according to the departmental dose-optimization program which includes automated exposure control, adjustment of the mA and/or kV according to patient size and/or use of iterative reconstruction technique.   COMPARISON:  06/11/21   FINDINGS: Lower chest: Lung bases are free of acute infiltrate or sizable effusion.   Hepatobiliary: No focal liver abnormality is seen. No  gallstones, gallbladder wall thickening, or biliary dilatation.   Pancreas: Unremarkable. No pancreatic ductal dilatation or surrounding inflammatory changes.   Spleen: Normal in size without focal abnormality.   Adrenals/Urinary Tract: Adrenal glands are within normal limits. Left kidney demonstrates no renal calculi or obstructive changes. Right kidney demonstrates moderate to severe hydronephrosis and hydroureter secondary to a mid ureteral stone measuring 9 mm. The more distal right ureter is within normal limits. The bladder is partially distended. Multiple nonobstructing renal calculi are noted on the right measuring up to 3 mm.   Stomach/Bowel: No obstructive or inflammatory changes of the colon are seen. The appendix is within normal limits. Small bowel and stomach are unremarkable.   Vascular/Lymphatic: No significant vascular findings are present. No enlarged abdominal or pelvic lymph nodes.   Reproductive: Uterus and bilateral adnexa are unremarkable.   Other: No abdominal wall hernia or abnormality. No abdominopelvic ascites.   Musculoskeletal: No fracture is seen.   IMPRESSION: 9 mm right mid ureteral stone with hydronephrosis and hydroureter.   Scattered small nonobstructing renal calculi are noted on the right measuring up to 3 mm.   No other focal abnormality is seen.     Electronically Signed   By: Alcide Clever M.D.   On: 08/17/2023 23:16     KUB right mid ureteral stone not visible, radiologist interpretation still pending I have independently reviewed the films.  See HPI.    Assessment & Plan:    1.  Right ureteral stone -We discussed various treatment options for urolithiasis including observation with or without medical expulsive therapy, shockwave lithotripsy (SWL), ureteroscopy and laser lithotripsy with stent placement. -We discussed that management is based on stone size, location, density, patient co-morbidities, and patient preference.   -Stones <62mm in size have a >80% spontaneous passage rate. Data surrounding the use of tamsulosin for medical expulsive therapy is controversial, but meta analyses suggests it is most efficacious for distal stones between 5-54mm in size. Possible side effects include dizziness/lightheadedness -her stone burden is greater than 5  mm in size -SWL has a lower stone free rate in a single procedure, but also a lower complication rate compared to ureteroscopy and avoids a stent and associated stent related symptoms. Possible complications include renal hematoma, steinstrasse, and need for additional treatment, but she is not a candidate for the procedure as stone is not visible on KUB -Ureteroscopy with laser lithotripsy and stent placement has a higher stone free rate than SWL in a single procedure, however increased complication rate including possible infection, ureteral injury, bleeding, and stent related morbidity. Common stent related symptoms include dysuria, urgency/frequency, and flank pain. -She is agreeable to undergo ureteroscopy - schedule right ureteroscopy with laser lithotripsy and ureteral stent placement - explained to the patient how the procedure is performed and the risks involved -informed the patient that they will have an ureteral stent, which will remain in place for approximately 3-10 days and can be associated with flank pain, bladder pain, dysuria, urgency, frequency, urinary leakage, and gross hematuria. - stent may be removed in the office with a cystoscope or patient may be instructed to remove the stent themselves by the string and that is decided on the day of the procedure - residual stones within the kidney or ureter may be present after the procedure and may need to have these addressed at a different encounter - injury to the ureter is the most common intra-operative risk, it may result in an open procedure to correct the defect - infection and bleeding are also risks -  explained the risks of general anesthesia, such as: MI, CVA, paralysis, coma and/or death. - advised to contact our office or seek treatment in the ED if becomes febrile or pain/ vomiting are difficult control in order to arrange for emergent/urgent intervention -Refills given on tamsulosin and her Bactrim -Prescribed oxycodone IR 5 mg, every 6 hours as needed for pain #10  2.  Microscopic hematuria -UA w/ micro heme -urine culture pending -Likely secondary to stone -Will continue to monitor once stone has been treated to ensure hematuria clears with treatment of the stone    Return for right URS/LL/ureteral stent placement.   Cloretta Ned   [facility] [address] [place], [place] [address] [phone]  Surgical Physician Order Form [facility] [place]  * Scheduling expectation : ASAP w/ Dr. Apolinar Junes  *Length of Case:   *Clearance needed: no  *Anticoagulation Instructions: N/A  *Aspirin Instructions: N/A  *Post-op visit Date/Instructions:   TBD   *Diagnosis: Right Ureteral Stone  *Procedure: right Ureteroscopy w/laser lithotripsy & stent placement ([address])   Additional orders: N/A  -Admit type: OUTpatient  -Anesthesia: General  -VTE Prophylaxis Standing Order SCD's       Other:   -Standing Lab Orders Per Anesthesia    Lab other: None  -Standing Test orders EKG/Chest x-ray per Anesthesia       Test other:   - Medications:  Ancef 2gm IV  -Other orders:  N/A

## 2023-08-20 ENCOUNTER — Other Ambulatory Visit: Payer: Self-pay

## 2023-08-20 ENCOUNTER — Ambulatory Visit
Admission: RE | Admit: 2023-08-20 | Discharge: 2023-08-20 | Disposition: A | Discharge status: Home / Self Care | Payer: Self-pay | Source: Ambulatory Visit | Attending: Urology | Admitting: Urology

## 2023-08-20 ENCOUNTER — Encounter: Payer: Self-pay | Admitting: Urology

## 2023-08-20 ENCOUNTER — Ambulatory Visit: Payer: Self-pay | Admitting: Urology

## 2023-08-20 VITALS — BP 117/81 | HR 93 | Temp 98.0°F | Ht 62.0 in | Wt 159.0 lb

## 2023-08-20 DIAGNOSIS — R3129 Other microscopic hematuria: Secondary | ICD-10-CM

## 2023-08-20 DIAGNOSIS — N200 Calculus of kidney: Secondary | ICD-10-CM | POA: Insufficient documentation

## 2023-08-20 DIAGNOSIS — N201 Calculus of ureter: Secondary | ICD-10-CM

## 2023-08-20 LAB — URINALYSIS, COMPLETE
Bilirubin, UA: NEGATIVE
Glucose, UA: NEGATIVE
Nitrite, UA: NEGATIVE
Specific Gravity, UA: 1.025 (ref 1.005–1.030)
Urobilinogen, Ur: 0.2 mg/dL (ref 0.2–1.0)
pH, UA: 6 (ref 5.0–7.5)

## 2023-08-20 LAB — MICROSCOPIC EXAMINATION
Epithelial Cells (non renal): >10 /HPF — AB (ref 0–10)
RBC, Urine: >30 /HPF — AB (ref 0–2)
WBC, UA: >30 /HPF — AB (ref 0–5)

## 2023-08-20 MED ORDER — TAMSULOSIN HCL 0.4 MG PO CAPS: 0.4000 mg | ORAL_CAPSULE | Freq: Every day | ORAL | 1 refills | Status: DC

## 2023-08-20 MED ORDER — SULFAMETHOXAZOLE-TRIMETHOPRIM 800-160 MG PO TABS: 1.0000 | ORAL_TABLET | Freq: Two times a day (BID) | ORAL | 0 refills | Status: AC

## 2023-08-20 MED ORDER — OXYCODONE HCL 5 MG PO TABS: 5.0000 mg | ORAL_TABLET | Freq: Four times a day (QID) | ORAL | 0 refills | Status: AC | PRN

## 2023-08-20 NOTE — Progress Notes (Signed)
 Surgical Physician Order Form [facility] [place]  * Scheduling expectation : ASAP w/ Dr. Apolinar Junes  *Length of Case:   *Clearance needed: no  *Anticoagulation Instructions: N/A  *Aspirin Instructions: N/A  *Post-op visit Date/Instructions:   TBD   *Diagnosis: Right Ureteral Stone  *Procedure: right Ureteroscopy w/laser lithotripsy & stent placement ([address])   Additional orders: N/A  -Admit type: OUTpatient  -Anesthesia: General  -VTE Prophylaxis Standing Order SCD's       Other:   -Standing Lab Orders Per Anesthesia    Lab other: None  -Standing Test orders EKG/Chest x-ray per Anesthesia       Test other:   - Medications:  Ancef 2gm IV  -Other orders:  N/A

## 2023-08-20 NOTE — Progress Notes (Signed)
   [facility] Surgical Posting Form  Surgery Date: Date: 08/26/2023  Surgeon: Dr. Vanna Scotland, MD  Inpt ( No  )   Outpt (Yes)   Obs ( No  )   Diagnosis: N20.1 Right Ureteral Stone  -CPT: [phone]  Surgery: Right Ureteroscopy with Laser Lithotripsy and Stent Placement   Stop Anticoagulations: No  Cardiac/Medical/Pulmonary Clearance needed: no  *Orders entered into EPIC  Date: 08/20/23   *Case booked in [place]  Date: 08/20/23  *Notified pt of Surgery: Date: 08/20/23  PRE-OP UA & CX: no  *Placed into Prior Authorization Work [place] Date: 08/20/23  Assistant/laser/rep:No

## 2023-08-21 ENCOUNTER — Inpatient Hospital Stay
Admission: RE | Admit: 2023-08-21 | Discharge: 2023-08-21 | Disposition: A | Discharge status: Home / Self Care | Payer: Self-pay | Source: Ambulatory Visit

## 2023-08-21 HISTORY — DX: Tubulo-interstitial nephritis, not specified as acute or chronic: N12

## 2023-08-21 NOTE — Patient Instructions (Addendum)
 Your procedure is scheduled on:  Monday March 10  Report to the Registration Desk on the 1st floor of the [facility] To find out your arrival time, please call [phone] between 1PM - 3PM on:   Friday March 7  If your arrival time is 6:00 am, do not arrive before that time as the Medical Mall entrance doors do not open until 6:00 am.  REMEMBER: Instructions that are not followed completely may result in serious medical risk, up to and including death; or upon the discretion of your surgeon and anesthesiologist your surgery may need to be rescheduled.  Do not eat food after midnight the night before surgery.  No gum chewing or hard candies.  One week prior to surgery: Starting Monday March 3  Stop Anti-inflammatories (NSAIDS) such as Advil, Aleve, Ibuprofen, Motrin, Naproxen, Naprosyn and Aspirin based products such as Excedrin, Goody's Powder, BC Powder. Stop ANY OVER THE COUNTER supplements until after surgery.  You may however, continue to take Tylenol if needed for pain up until the day of surgery.  Continue taking all of your other prescription medications up until the day of surgery.  ON THE DAY OF SURGERY ONLY TAKE THESE MEDICATIONS WITH SIPS OF WATER:  levothyroxine (SYNTHROID, LEVOTHROID)   No Alcohol for 24 hours before or after surgery.  No Smoking including e-cigarettes for 24 hours before surgery.  No chewable tobacco products for at least 6 hours before surgery.  No nicotine patches on the day of surgery.  Do not use any "recreational" drugs for at least a week (preferably 2 weeks) before your surgery.  Please be advised that the combination of cocaine and anesthesia may have negative outcomes, up to and including death. If you test positive for cocaine, your surgery will be cancelled.  On the morning of surgery brush your teeth with toothpaste and water, you may rinse your mouth with mouthwash if you wish. Do not swallow any toothpaste or mouthwash.  Do not  wear jewelry, make-up, hairpins, clips or nail polish.  For welded (permanent) jewelry: bracelets, anklets, waist bands, etc.  Please have this removed prior to surgery.  If it is not removed, there is a chance that hospital personnel will need to cut it off on the day of surgery.  Do not wear lotions, powders, or perfumes.   Do not shave body hair from the neck down 48 hours before surgery.  Contact lenses, hearing aids and dentures may not be worn into surgery.  Do not bring valuables to the hospital. [facility] is not responsible for any missing/lost belongings or valuables.   Notify your doctor if there is any change in your medical condition (cold, fever, infection).  Wear comfortable clothing (specific to your surgery type) to the hospital.  After surgery, you can help prevent lung complications by doing breathing exercises.  Take deep breaths and cough every 1-2 hours.   If you are being discharged the day of surgery, you will not be allowed to drive home. You will need a responsible individual to drive you home and stay with you for 24 hours after surgery.   If you are taking public transportation, you will need to have a responsible individual with you.  Please call the Pre-admissions Testing Dept. at [phone] if you have any questions about these instructions.  Surgery Visitation Policy:  Patients having surgery or a procedure may have two visitors.  Children under the age of 59 must have an adult with them who is  not the patient.  Temporary Visitor Restrictions Due to increasing cases of flu, RSV and COVID-19: Children ages 56 and under will not be able to visit patients in [facility] hospitals under most circumstances.

## 2023-08-22 ENCOUNTER — Encounter: Payer: Self-pay | Admitting: Urgent Care

## 2023-08-22 ENCOUNTER — Encounter: Payer: Self-pay | Admitting: Anesthesiology

## 2023-08-23 ENCOUNTER — Other Ambulatory Visit: Payer: Self-pay

## 2023-08-23 ENCOUNTER — Encounter
Admission: RE | Admit: 2023-08-23 | Discharge: 2023-08-23 | Disposition: A | Discharge status: Home / Self Care | Payer: Self-pay | Source: Ambulatory Visit | Attending: Urology | Admitting: Urology

## 2023-08-23 VITALS — Ht 62.0 in | Wt 159.0 lb

## 2023-08-23 DIAGNOSIS — Z01818 Encounter for other preprocedural examination: Secondary | ICD-10-CM

## 2023-08-23 LAB — CULTURE, URINE COMPREHENSIVE

## 2023-08-23 NOTE — Patient Instructions (Addendum)
 Your procedure is scheduled on: Monday 08/26/23 To find out your arrival time, please call [phone] between 1PM - 3PM on: Friday 08/23/23   Report to the Registration Desk on the 1st floor of the Medical Mall. Free Valet parking is available.  If your arrival time is 6:00 am, do not arrive before that time as the Medical Mall entrance doors do not open until 6:00 am.  REMEMBER: Instructions that are not followed completely may result in serious medical risk, up to and including death; or upon the discretion of your surgeon and anesthesiologist your surgery may need to be rescheduled.  Do not eat food or drink any liquids after midnight the night before surgery.  No gum chewing or hard candies.  One week prior to surgery: Stop Anti-inflammatories (NSAIDS) such as Advil, Aleve, Ibuprofen, Motrin, Naproxen, Naprosyn and Aspirin based products such as Excedrin, Goody's Powder, BC Powder. You may however, continue to take Tylenol if needed for pain up until the day of surgery.  Stop ANY OVER THE COUNTER supplements and vitamins until after surgery.  Continue taking all prescribed medications.   TAKE ONLY THESE MEDICATIONS THE MORNING OF SURGERY WITH A SIP OF WATER:  levothyroxine (SYNTHROID, LEVOTHROID) 100 MCG tablet  tamsulosin (FLOMAX) 0.4 MG CAPS capsule  sulfamethoxazole-trimethoprim (BACTRIM DS) 800-160 MG tablet  oxyCODONE (ROXICODONE) 5 MG immediate release tablet   No Alcohol for 24 hours before or after surgery.  No Smoking including e-cigarettes for 24 hours before surgery.  No chewable tobacco products for at least 6 hours before surgery.  No nicotine patches on the day of surgery.  Do not use any "recreational" drugs for at least a week (preferably 2 weeks) before your surgery.  Please be advised that the combination of cocaine and anesthesia may have negative outcomes, up to and including death. If you test positive for cocaine, your surgery will be cancelled.  On  the morning of surgery brush your teeth with toothpaste and water, you may rinse your mouth with mouthwash if you wish. Do not swallow any toothpaste or mouthwash.  Shower the day of surgery  Do not wear lotions, powders, or perfumes.   Do not shave body hair from the neck down 48 hours before surgery.  Wear comfortable clothing (specific to your surgery type) to the hospital.  Do not wear jewelry, make-up, hairpins, clips or nail polish.  For welded (permanent) jewelry: bracelets, anklets, waist bands, etc.  Please have this removed prior to surgery.  If it is not removed, there is a chance that hospital personnel will need to cut it off on the day of surgery. Contact lenses, hearing aids and dentures may not be worn into surgery.  Do not bring valuables to the hospital. [facility] is not responsible for any missing/lost belongings or valuables.   Notify your doctor if there is any change in your medical condition (cold, fever, infection).  If you are being discharged the day of surgery, you will not be allowed to drive home. You will need a responsible individual to drive you home and stay with you for 24 hours after surgery.   If you are taking public transportation, you will need to have a responsible individual with you.  If you are being admitted to the hospital overnight, leave your suitcase in the car. After surgery it may be brought to your room.  In case of increased patient census, it may be necessary for you, the patient, to continue your postoperative care in the  Same Day Surgery department.  After surgery, you can help prevent lung complications by doing breathing exercises.  Take deep breaths and cough every 1-2 hours. Your doctor may order a device called an Incentive Spirometer to help you take deep breaths. When coughing or sneezing, hold a pillow firmly against your incision with both hands. This is called "splinting." Doing this helps protect your incision. It also  decreases belly discomfort.  Surgery Visitation Policy:  Patients undergoing a surgery or procedure may have two family members or support persons with them as long as the person is not COVID-19 positive or experiencing its symptoms.   Inpatient Visitation:    Visiting hours are 7 a.m. to 8 p.m. Up to four visitors are allowed at one time in a patient room. The visitors may rotate out with other people during the day. One designated support person (adult) may remain overnight.  Due to an increase in RSV and influenza rates and associated hospitalizations, children ages 10 and under will not be able to visit patients in [facility]. Masks continue to be strongly recommended.  Please call the Pre-admissions Testing Dept. at [phone] if you have any questions about these instructions.

## 2023-08-26 ENCOUNTER — Ambulatory Visit
Admission: RE | Admit: 2023-08-26 | Discharge: 2023-08-26 | Disposition: A | Discharge status: Home / Self Care | Payer: Self-pay | Attending: Urology | Admitting: Urology

## 2023-08-26 ENCOUNTER — Other Ambulatory Visit: Payer: Self-pay

## 2023-08-26 ENCOUNTER — Encounter
Admission: RE | Disposition: A | Discharge status: Home / Self Care | Payer: Self-pay | Source: Home / Self Care | Attending: Urology

## 2023-08-26 ENCOUNTER — Encounter: Payer: Self-pay | Admitting: Urology

## 2023-08-26 DIAGNOSIS — Z01818 Encounter for other preprocedural examination: Secondary | ICD-10-CM

## 2023-08-26 DIAGNOSIS — N201 Calculus of ureter: Secondary | ICD-10-CM | POA: Insufficient documentation

## 2023-08-26 DIAGNOSIS — Z539 Procedure and treatment not carried out, unspecified reason: Secondary | ICD-10-CM | POA: Insufficient documentation

## 2023-08-26 LAB — POCT PREGNANCY, URINE: Preg Test, Ur: NEGATIVE

## 2023-08-26 SURGERY — CYSTOSCOPY/URETEROSCOPY/HOLMIUM LASER/STENT PLACEMENT
Anesthesia: General | Laterality: Right

## 2023-08-26 MED ORDER — ORAL CARE MOUTH RINSE: 15.0000 mL | Freq: Once | OROMUCOSAL | Status: DC

## 2023-08-26 MED ORDER — CEFAZOLIN SODIUM-DEXTROSE 2-4 GM/100ML-% IV SOLN: 2.0000 g | INTRAVENOUS | Status: DC

## 2023-08-26 MED ORDER — LACTATED RINGERS IV SOLN: INTRAVENOUS | Status: DC

## 2023-08-26 MED ORDER — CHLORHEXIDINE GLUCONATE 0.12 % MT SOLN: 15.0000 mL | Freq: Once | OROMUCOSAL | Status: DC

## 2023-08-26 NOTE — Progress Notes (Signed)
 Patient passed stone 08/25/23, had stone in container and video. Case canceled. Informed OR desk

## 2023-08-26 NOTE — Progress Notes (Signed)
 Patient seen and examined.  She showed me pictures of a lot of stone debris in the toilet last Friday and she brings approximately 5 mm stone with her.  I reviewed the CT scan.  It is likely that she did in fact passed the stone as it appears that there were 2 stones adjacent to each other.  She is having no pain since last week.  Will plan to have her follow-up in 4 weeks with renal ultrasound prior.  Will cancel the case for today, she should let us know if she has any recurrent pain.  Vanna Scotland, MD

## 2023-08-26 NOTE — H&P (Signed)
 Case canceled, interval passage of stone.

## 2023-09-17 ENCOUNTER — Ambulatory Visit
Admission: RE | Admit: 2023-09-17 | Discharge: 2023-09-17 | Disposition: A | Discharge status: Home / Self Care | Payer: Self-pay | Source: Ambulatory Visit | Attending: Urology | Admitting: Urology

## 2023-09-17 DIAGNOSIS — N201 Calculus of ureter: Secondary | ICD-10-CM | POA: Insufficient documentation

## 2023-09-23 NOTE — Progress Notes (Unsigned)
 09/24/23 11:34 AM   Jasmine Henderson 06/30/1975 [id number]  Referring provider:  Sandrea Hughs, NP [address] [place],  [place] [address]  Urological history: 1. rUTI's -Contributing factors of age and vaginal atrophy -Documented urine cultures over the last year  August 17, 2023, multiple species present  2.  Nephrolithiasis -Stone composition of 30% magnesium ammonium phosphate, 52% calcium phosphate, 3% calcium oxalate monohydrate and 15% ammonium urate -Right PCNL (2019) -Right URS (2019) -Right URS (2022)   3. Left ureterocele/chronic hydronephrosis  Chief Complaint  Patient presents with   Follow-up    HPI: Jasmine Henderson is a 48 y.o.female who presents today for ED follow up with interpreter, Loyda.     Previous records reviewed.     She is scheduled for ureteroscopy on August 26, 2023, but fortunately, she passed the stone spontaneously prior to her procedure.  RUS (09/2023) mild right hydronephrosis  She is still having some mild right-sided flank pain.  On a scale of 1-10 it is a 4.   Patient denies any modifying or aggravating factors.  Patient denies any recent UTI's, gross hematuria, dysuria or suprapubic/flank pain.  Patient denies any fevers, chills, nausea or vomiting.    UA yellow clear, specific gravity 1.015, 3+ heme, pH 6.0, trace leuks, 0-5 WBCs, 3-10 RBCs, 0-10 epithelial cells, amorphous sediment present, mucus threads present and moderate bacteria.  She states she is currently on her menstrual cycle.  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 unknown reason. NO treatment. 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    Thyroid disease     Surgical History: Past Surgical History:  Procedure Laterality Date   CARDIAC CATHETERIZATION     pt denies 06/01/21   CYSTOSCOPY/URETEROSCOPY/HOLMIUM LASER/STENT PLACEMENT Left 09/24/2017   Procedure: CYSTOSCOPY/URETEROSCOPY/left retrorade pyleogram;  Surgeon: Riki Altes, MD;  Location: [facility];  Service: Urology;  Laterality: Left;   CYSTOSCOPY/URETEROSCOPY/HOLMIUM LASER/STENT PLACEMENT Right 12/18/2017   Procedure: CYSTOSCOPY/URETEROSCOPY/HOLMIUM LASER/STENT Exchange;  Surgeon: Vanna Scotland, MD;  Location: [facility];  Service: Urology;  Laterality: Right;   CYSTOSCOPY/URETEROSCOPY/HOLMIUM LASER/STENT PLACEMENT Right 06/08/2021   Procedure: CYSTOSCOPY/URETEROSCOPY/HOLMIUM LASER/STENT PLACEMENT;  Surgeon: Vanna Scotland, MD;  Location: [facility];  Service: Urology;  Laterality: Right;   IR NEPHROSTOMY PLACEMENT RIGHT  11/25/2017   NEPHROLITHOTOMY Right 11/25/2017   Procedure: NEPHROLITHOTOMY PERCUTANEOUS;  Surgeon: Vanna Scotland, MD;  Location: [facility];  Service: Urology;  Laterality: Right;    Home Medications:  Allergies as of 09/24/2023   No Known Allergies      Medication List        Accurate as of September 24, 2023 11:34 AM. If you have any questions, ask your nurse or doctor.          STOP taking these medications    tamsulosin 0.4 MG Caps capsule Commonly known as: FLOMAX       TAKE these medications    levothyroxine 100 MCG tablet Commonly known as: SYNTHROID Take 100 mcg by mouth daily before breakfast.        Allergies: No Known Allergies  Family History: Family History  Problem Relation Age of Onset   Bladder Cancer Neg Hx  Kidney cancer Neg Hx     Social History:  reports that she has never smoked. She has never used smokeless tobacco. She reports that she does not drink alcohol and does not use drugs.   Physical Exam: BP 125/85   Pulse 76   Ht 5\' 2"  (1.575 m)   Wt 156 lb 4.8 oz (70.9 kg)   BMI 28.59 kg/m    Constitutional:  Well nourished. Alert and oriented, No acute distress. HEENT: [employer] AT, moist mucus membranes.  Trachea midline Cardiovascular: No clubbing, cyanosis, or edema. Respiratory: Normal respiratory effort, no increased work of breathing. Neurologic: Grossly intact, no focal deficits, moving all 4 extremities. Psychiatric: Normal mood and affect.    Laboratory Data:  Urinalysis See EPIC and HPI I have reviewed the labs.    Pertinent Imaging: CLINICAL DATA:  Ureteral stone.   EXAM: RENAL / URINARY TRACT ULTRASOUND COMPLETE   COMPARISON:  Renal ultrasound July 07, 2021, CT abdomen pelvis August 17, 2023   FINDINGS: Right Kidney:   Renal measurements: 10.6 x 5.5 x 5.2 cm = volume: 1 cc mL. 8.2 mm nonobstructing stone noted in the right kidney. Echogenicity within normal limits. No mass visualized. Mild right hydronephrosis.   Left Kidney:   Renal measurements: 12.3 x 5.2 x 5.3 cm = volume: 177 mL. Echogenicity within normal limits. No mass or hydronephrosis visualized.   Bladder:   Left ureterocele noted. Bladder is otherwise normal. Bilateral ureteral jets are noted.   Other:   None.   IMPRESSION: 1. Mild right hydronephrosis. 2. 8.2 mm nonobstructing stone noted in the right kidney.     Electronically Signed   By: Sherian Rein M.D.   On: 09/18/2023 08:57 I have independently reviewed the films.  See HPI.    Assessment & Plan:    1.  Right ureteral stone - Spontaneously passed -Explained that the mild hydronephrosis seen on recent ultrasound may just be residual swelling from the passed stones and recommended follow-up renal ultrasound in 3 months  2.  Microscopic hematuria -UA w/ micro heme - but she is on her menstrual cycle  -urine culture pending -Likely secondary to passage of stone or low grade infection -Will continue to monitor once stone has been treated to ensure hematuria clears with treatment of the stone -I did not prescribe an  antibiotic at this time, but I did instruct her to contact the office next week for culture results -If her urine culture is positive I would go ahead recommend to treat as she is having some intermittent right sided flank discomfort and with the residual mild right hydronephrosis, I am concerned that she may be at risk for developing pyelonephritis in the future -We reviewed red flag signs and I instructed her to contact the clinic or seek treatment in the ED if she should experience those  3.  Nephrolithiasis - We discussed general stone prevention, but she did keep the stone she recently passed so we will go ahead and send those for analysis as well to see if her stone composition is changed when she brings them in  - She did undergo a metabolic workup in 2024 and if her stone analysis has not changed, we will review these findings more in detail when she returns in 3 months for her repeat ultrasound as she is wanting to prevent further stones in the future   Return in about 3 months (around 12/24/2023) for repeat UA, repeat RUS and discuss [facility] results .   Jilda Kress  Eula Flax   [facility] [address] [place], [place] [address] [phone]

## 2023-09-24 ENCOUNTER — Encounter: Payer: Self-pay | Admitting: Urology

## 2023-09-24 ENCOUNTER — Ambulatory Visit (INDEPENDENT_AMBULATORY_CARE_PROVIDER_SITE_OTHER): Payer: Self-pay | Admitting: Urology

## 2023-09-24 VITALS — BP 125/85 | HR 76 | Ht 62.0 in | Wt 156.3 lb

## 2023-09-24 DIAGNOSIS — N200 Calculus of kidney: Secondary | ICD-10-CM

## 2023-09-24 DIAGNOSIS — R3129 Other microscopic hematuria: Secondary | ICD-10-CM

## 2023-09-24 DIAGNOSIS — N201 Calculus of ureter: Secondary | ICD-10-CM

## 2023-09-24 DIAGNOSIS — Z87442 Personal history of urinary calculi: Secondary | ICD-10-CM

## 2023-09-24 LAB — MICROSCOPIC EXAMINATION

## 2023-09-24 LAB — URINALYSIS, COMPLETE
Bilirubin, UA: NEGATIVE
Glucose, UA: NEGATIVE
Ketones, UA: NEGATIVE
Nitrite, UA: NEGATIVE
Protein,UA: NEGATIVE
Specific Gravity, UA: 1.015 (ref 1.005–1.030)
Urobilinogen, Ur: 0.2 mg/dL (ref 0.2–1.0)
pH, UA: 6 (ref 5.0–7.5)

## 2023-09-27 LAB — CULTURE, URINE COMPREHENSIVE

## 2023-09-30 ENCOUNTER — Other Ambulatory Visit: Payer: Self-pay

## 2023-09-30 MED ORDER — FLUCONAZOLE 150 MG PO TABS
150.0000 mg | ORAL_TABLET | Freq: Once | ORAL | 0 refills | Status: AC
Start: 2023-09-30 — End: 2023-09-30

## 2023-09-30 MED ORDER — AMOXICILLIN-POT CLAVULANATE 875-125 MG PO TABS: 1.0000 | ORAL_TABLET | Freq: Two times a day (BID) | ORAL | 0 refills | Status: DC

## 2023-10-01 LAB — CALCULI, WITH PHOTOGRAPH (CLINICAL LAB)
Carbonate Apatite: 30 %
Mg NH4 PO4 (Struvite): 70 %
Weight Calculi: 36 mg

## 2023-10-07 ENCOUNTER — Other Ambulatory Visit: Payer: Self-pay | Admitting: Urology

## 2023-10-07 DIAGNOSIS — N1330 Unspecified hydronephrosis: Secondary | ICD-10-CM

## 2023-10-07 DIAGNOSIS — N200 Calculus of kidney: Secondary | ICD-10-CM

## 2023-10-09 ENCOUNTER — Other Ambulatory Visit: Payer: Self-pay

## 2023-10-09 DIAGNOSIS — N200 Calculus of kidney: Secondary | ICD-10-CM

## 2023-10-23 ENCOUNTER — Other Ambulatory Visit: Payer: Self-pay

## 2023-10-23 DIAGNOSIS — N200 Calculus of kidney: Secondary | ICD-10-CM

## 2023-10-24 LAB — LITHOLINK SERUM PANEL
CO2: 23 mmol/L (ref 20–29)
Calcium: 9 mg/dL (ref 8.7–10.2)
Chloride: 105 mmol/L (ref 96–106)
Creatinine, Ser: 0.61 mg/dL (ref 0.57–1.00)
Magnesium: 1.9 mg/dL (ref 1.6–2.3)
Phosphorus: 3.4 mg/dL (ref 3.0–4.3)
Potassium: 4.2 mmol/L (ref 3.5–5.2)
Sodium: 141 mmol/L (ref 134–144)
Uric Acid: 5 mg/dL (ref 2.6–6.2)
eGFR: 112 mL/min/{1.73_m2} (ref >59)

## 2023-10-29 LAB — LITHOLINK 24HR URINE PANEL
Ammonium, Urine: 57 mmol/(24.h) (ref 15–60)
Calcium Oxalate Saturation: 1.93 — ABNORMAL LOW (ref 6.00–10.00)
Calcium Phosphate Saturation: 0.84 (ref 0.50–2.00)
Calcium, Urine: 258 mg/(24.h) — ABNORMAL HIGH (ref <200)
Calcium/Creatinine Ratio: 155 mg/g{creat} (ref 51–262)
Calcium/Kg Body Weight: 3.6 mg/kg/d (ref <4.0)
Chloride, Urine: 269 mmol/(24.h) — ABNORMAL HIGH (ref 70–250)
Citrate, Urine: 700 mg/(24.h) (ref >550)
Creatinine, Urine: 1664 mg/(24.h)
Creatinine/Kg Body Weight: 23.4 mg/kg/d — ABNORMAL HIGH (ref 8.7–20.3)
Magnesium, Urine: 153 mg/(24.h) — ABNORMAL HIGH (ref 30–120)
Oxalate, Urine: 22 mg/(24.h) (ref 20–40)
Phosphorus, Urine: 1670 mg/(24.h) — ABNORMAL HIGH (ref 600–1200)
Potassium, Urine: 82 mmol/(24.h) (ref 20–100)
Protein Catabolic Rate: 1.6 g/kg/d — ABNORMAL HIGH (ref 0.8–1.4)
Sodium, Urine: 264 mmol/(24.h) — ABNORMAL HIGH (ref 50–150)
Sulfate, Urine: 66 meq/(24.h) (ref 20–80)
Urea Nitrogen, Urine: 16.1 g/(24.h) — ABNORMAL HIGH (ref 6.00–14.00)
Uric Acid Saturation: 0.66 (ref <1.00)
Uric Acid, Urine: 1228 mg/(24.h) — ABNORMAL HIGH (ref <750)
Urine Volume (Preserved): 3190 mL/(24.h) (ref 500–4000)
pH, 24 hr, Urine: 6.087 (ref 5.800–6.200)

## 2023-10-31 ENCOUNTER — Ambulatory Visit: Payer: Self-pay | Admitting: Urology

## 2023-12-23 NOTE — Progress Notes (Deleted)
 12/23/23 9:37 PM   Jasmine Henderson 1975-11-24 [id number]  Referring provider:  Era Raisin, NP [address] [place],  [place] [address]  Urological history: 1. rUTI's - September 24, 2023 - Beta hemolytic streptococcus, group B - August 20, 2023, No growth - August 17, 2023, multiple species present  2.  Nephrolithiasis -Stone composition of 30% magnesium ammonium phosphate, 52% calcium phosphate, 3% calcium oxalate monohydrate and 15% ammonium urate -Right PCNL (2019) -Right URS (2019) -Right URS (2022) -spontaneous passage of stone (08/2023)   3. Left ureterocele/chronic hydronephrosis  No chief complaint on file.   HPI: Jasmine Henderson is a 48 y.o.female who presents today for 3 months follow up ***      Previous records reviewed.     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 unknown reason. NO treatment. 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   Thyroid disease     Surgical History: Past Surgical History:  Procedure Laterality Date   CARDIAC CATHETERIZATION     pt denies 06/01/21   CYSTOSCOPY/URETEROSCOPY/HOLMIUM LASER/STENT PLACEMENT Left 09/24/2017   Procedure: CYSTOSCOPY/URETEROSCOPY/left retrorade pyleogram;  Surgeon: Jasmine Glendia BROCKS, MD;  Location: [facility];  Service: Urology;  Laterality: Left;   CYSTOSCOPY/URETEROSCOPY/HOLMIUM LASER/STENT PLACEMENT Right 12/18/2017   Procedure: CYSTOSCOPY/URETEROSCOPY/HOLMIUM LASER/STENT Exchange;  Surgeon: Jasmine Knee, MD;  Location: [facility];  Service: Urology;  Laterality: Right;   CYSTOSCOPY/URETEROSCOPY/HOLMIUM LASER/STENT PLACEMENT Right  06/08/2021   Procedure: CYSTOSCOPY/URETEROSCOPY/HOLMIUM LASER/STENT PLACEMENT;  Surgeon: Jasmine Knee, MD;  Location: [facility];  Service: Urology;  Laterality: Right;   IR NEPHROSTOMY PLACEMENT RIGHT  11/25/2017   NEPHROLITHOTOMY Right 11/25/2017   Procedure: NEPHROLITHOTOMY PERCUTANEOUS;  Surgeon: Jasmine Knee, MD;  Location: [facility];  Service: Urology;  Laterality: Right;    Home Medications:  Allergies as of 12/24/2023   No Known Allergies      Medication List        Accurate as of December 23, 2023  9:37 PM. If you have any questions, ask your nurse or doctor.          amoxicillin -clavulanate 875-125 MG tablet Commonly known as: AUGMENTIN  Take 1 tablet by mouth every 12 (twelve) hours.   levothyroxine  100 MCG tablet Commonly known as: SYNTHROID  Take 100 mcg by mouth daily before breakfast.        Allergies: No Known Allergies  Family History: Family History  Problem Relation Age of Onset   Bladder Cancer Neg Hx    Kidney cancer Neg Hx     Social History:  reports that she has never smoked. She has never used smokeless tobacco. She reports that she does not drink alcohol and does not use drugs.   Physical Exam: There were no vitals taken for this visit.  Constitutional:  Well nourished. Alert and oriented, No acute distress. HEENT: [place] AT, moist mucus membranes.  Trachea midline, no masses. Cardiovascular: No clubbing, cyanosis, or edema. Respiratory: Normal respiratory effort, no increased work of breathing. GU: No CVA tenderness.  No bladder fullness or masses.  Recession of labia minora, dry, pale vulvar vaginal mucosa and loss of mucosal ridges and folds.  Normal urethral meatus, no lesions, no prolapse, no discharge.   No urethral masses, tenderness and/or tenderness. No bladder fullness, tenderness or masses. *** vagina mucosa, *** estrogen effect, no discharge, no lesions, *** pelvic support, *** cystocele and *** rectocele noted.  No cervical motion  tenderness.  Uterus is freely mobile and non-fixed.  No adnexal/parametria masses or tenderness noted.  Anus and perineum are without rashes or lesions.   ***  Neurologic: Grossly intact, no focal deficits, moving all 4 extremities. Psychiatric: Normal mood and affect.    Laboratory Data:  See EPIC and HPI I have reviewed the labs.   Pertinent Imaging: N/A  Assessment & Plan:    1.  Microscopic hematuria - ***  2.  Nephrolithiasis - ***  3. Right hydronephrosis - RUS pending   No follow-ups on file.   Jasmine Henderson   [facility] [address], [address] [place], [place] [address] [phone]

## 2023-12-24 ENCOUNTER — Ambulatory Visit: Payer: Self-pay | Admitting: Urology

## 2023-12-24 DIAGNOSIS — R3129 Other microscopic hematuria: Secondary | ICD-10-CM

## 2023-12-24 DIAGNOSIS — N1330 Unspecified hydronephrosis: Secondary | ICD-10-CM

## 2023-12-24 DIAGNOSIS — N200 Calculus of kidney: Secondary | ICD-10-CM

## 2023-12-27 ENCOUNTER — Encounter: Payer: Self-pay | Admitting: Urology

## 2024-03-17 ENCOUNTER — Other Ambulatory Visit: Payer: Self-pay | Admitting: Urology

## 2024-03-17 DIAGNOSIS — N200 Calculus of kidney: Secondary | ICD-10-CM

## 2024-03-17 NOTE — Progress Notes (Deleted)
 03/17/24 10:22 PM   Jasmine Henderson Oct 24, 1975 [id number]  Referring provider:  Era Raisin, NP [address] [place],  [place] [address]  Urological history: 1. rUTI's -Contributing factors of age and vaginal atrophy -Documented urine cultures over the last year *** -September 24, 2023 Beta hemolytic Streptococcus, group B -August 17, 2023, multiple species present  2.  Nephrolithiasis -Stone composition of 30% magnesium ammonium phosphate, 52% calcium phosphate, 3% calcium oxalate monohydrate and 15% ammonium urate -Right PCNL (2019) -Right URS (2019) -Right URS (2022)   3. Left ureterocele/chronic hydronephrosis  No chief complaint on file.   HPI: Jasmine Henderson is a 48 y.o. woman who presents today  follow up with interpreter, ***  Previous records reviewed.     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 unknown reason. NO treatment. 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   Thyroid disease     Surgical History: Past Surgical History:  Procedure Laterality Date   CARDIAC CATHETERIZATION     pt denies 06/01/21   CYSTOSCOPY/URETEROSCOPY/HOLMIUM LASER/STENT PLACEMENT Left 09/24/2017   Procedure: CYSTOSCOPY/URETEROSCOPY/left retrorade pyleogram;  Surgeon: Twylla Glendia BROCKS, MD;  Location: [facility];  Service: Urology;  Laterality: Left;   CYSTOSCOPY/URETEROSCOPY/HOLMIUM LASER/STENT PLACEMENT Right 12/18/2017   Procedure: CYSTOSCOPY/URETEROSCOPY/HOLMIUM LASER/STENT Exchange;  Surgeon: Penne Knee, MD;  Location: [facility];  Service: Urology;  Laterality: Right;   CYSTOSCOPY/URETEROSCOPY/HOLMIUM LASER/STENT  PLACEMENT Right 06/08/2021   Procedure: CYSTOSCOPY/URETEROSCOPY/HOLMIUM LASER/STENT PLACEMENT;  Surgeon: Penne Knee, MD;  Location: [facility];  Service: Urology;  Laterality: Right;   IR NEPHROSTOMY PLACEMENT RIGHT  11/25/2017   NEPHROLITHOTOMY Right 11/25/2017   Procedure: NEPHROLITHOTOMY PERCUTANEOUS;  Surgeon: Penne Knee, MD;  Location: [facility];  Service: Urology;  Laterality: Right;    Home Medications:  Allergies as of 03/18/2024   No Known Allergies      Medication List        Accurate as of March 17, 2024 10:22 PM. If you have any questions, ask your nurse or doctor.          amoxicillin -clavulanate 875-125 MG tablet Commonly known as: AUGMENTIN  Take 1 tablet by mouth every 12 (twelve) hours.   levothyroxine  100 MCG tablet Commonly known as: SYNTHROID  Take 100 mcg by mouth daily before breakfast.        Allergies: No Known Allergies  Family History: Family History  Problem Relation Age of Onset   Bladder Cancer Neg Hx    Kidney cancer Neg Hx     Social History:  reports that she has never smoked. She has never used smokeless tobacco. She reports that she does not drink alcohol and does not use drugs.   Physical Exam: There were no vitals taken for this visit.  Constitutional:  Well nourished. Alert and oriented, No acute distress. HEENT: [place] AT, moist mucus membranes.  Trachea midline, no masses. Cardiovascular: No clubbing, cyanosis, or edema. Respiratory: Normal respiratory effort, no increased work of breathing. GU: No CVA tenderness.  No bladder fullness or masses.  Recession of labia minora, dry, pale vulvar vaginal mucosa and loss of mucosal ridges and folds.  Normal urethral meatus,  no lesions, no prolapse, no discharge.   No urethral masses, tenderness and/or tenderness. No bladder fullness, tenderness or masses. *** vagina mucosa, *** estrogen effect, no discharge, no lesions, *** pelvic support, *** cystocele and *** rectocele noted.   No cervical motion tenderness.  Uterus is freely mobile and non-fixed.  No adnexal/parametria masses or tenderness noted.  Anus and perineum are without rashes or lesions.   ***  Neurologic: Grossly intact, no focal deficits, moving all 4 extremities. Psychiatric: Normal mood and affect.    Laboratory Data:  See EPIC and HPI I have reviewed the labs.    Pertinent Imaging: KUB ***, radiologist interpretation pending I have independently reviewed the films.  See HPI.    Assessment & Plan:    1.  Right renal stone - UA *** - KUB ***   2.  Microscopic hematuria - ***  3. Frequency - UA *** - PVR ***   No follow-ups on file.   Jasmine Henderson   [facility] [address], [address] [place], [place] [address] [phone]

## 2024-03-18 ENCOUNTER — Ambulatory Visit: Payer: Self-pay | Admitting: Urology

## 2024-03-18 DIAGNOSIS — R3129 Other microscopic hematuria: Secondary | ICD-10-CM

## 2024-03-18 DIAGNOSIS — N200 Calculus of kidney: Secondary | ICD-10-CM

## 2024-03-18 DIAGNOSIS — R350 Frequency of micturition: Secondary | ICD-10-CM

## 2024-03-24 NOTE — Progress Notes (Signed)
 03/29/24 4:37 PM   Erminio Cleverly May 31, 1976 [id number]  Referring provider:  Era Raisin, NP [address] [place],  [place] [address]  Urological history: 1. rUTI's -Contributing factors of age and vaginal atrophy -Documented urine cultures over the last year -September 24, 2023 Beta hemolytic Streptococcus, group B -August 20, 2023 no growth -August 17, 2023, multiple species present  2.  Nephrolithiasis -Stone composition of 30% magnesium ammonium phosphate, 52% calcium phosphate, 3% calcium oxalate monohydrate and 15% ammonium urate -Right PCNL (2019) -Right URS (2019) -Right URS (2022)   3. Left ureterocele   Chief Complaint  Patient presents with   Medical Management of Chronic Issues    HPI: Jasmine Henderson is a 48 y.o. woman who presents today for follow up with interpreter, Jasmine Henderson  Previous records reviewed.     She presents today for her delayed follow-up from July.  She still has urge incontinence and frequency.  She denied any right flank pain.  Patient denies any modifying or aggravating factors.  Patient denies any recent UTI's, gross hematuria, dysuria or suprapubic/flank pain.  Patient denies any fevers, chills, nausea or vomiting.    UA yellow clear, specific area 1.010, pH 6.0, 1+ heme, 0-5 WBCs, 3-10 RBCs, greater than 10 epithelial cells and moderate bacteria.  She just finished her menses.    KUB ? 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 unknown reason. NO treatment. 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   Thyroid disease     Surgical History: Past Surgical History:  Procedure  Laterality Date   CARDIAC CATHETERIZATION     pt denies 06/01/21   CYSTOSCOPY/URETEROSCOPY/HOLMIUM LASER/STENT PLACEMENT Left 09/24/2017   Procedure: CYSTOSCOPY/URETEROSCOPY/left retrorade pyleogram;  Surgeon: Twylla Glendia BROCKS, MD;  Location: [facility];  Service: Urology;  Laterality: Left;   CYSTOSCOPY/URETEROSCOPY/HOLMIUM LASER/STENT PLACEMENT Right 12/18/2017   Procedure: CYSTOSCOPY/URETEROSCOPY/HOLMIUM LASER/STENT Exchange;  Surgeon: Penne Knee, MD;  Location: [facility];  Service: Urology;  Laterality: Right;   CYSTOSCOPY/URETEROSCOPY/HOLMIUM LASER/STENT PLACEMENT Right 06/08/2021   Procedure: CYSTOSCOPY/URETEROSCOPY/HOLMIUM LASER/STENT PLACEMENT;  Surgeon: Penne Knee, MD;  Location: [facility];  Service: Urology;  Laterality: Right;   IR NEPHROSTOMY PLACEMENT RIGHT  11/25/2017   NEPHROLITHOTOMY Right 11/25/2017   Procedure: NEPHROLITHOTOMY PERCUTANEOUS;  Surgeon: Penne Knee, MD;  Location: [facility];  Service: Urology;  Laterality: Right;    Home Medications:  Allergies as of 03/25/2024   No Known Allergies      Medication List        Accurate as of March 25, 2024 11:59 PM. If you have any questions, ask your nurse or doctor.          amoxicillin -clavulanate 875-125 MG tablet Commonly known as: AUGMENTIN  Take 1 tablet by mouth every 12 (twelve) hours.   levothyroxine  100 MCG tablet Commonly known as: SYNTHROID  Take 100 mcg by mouth daily before breakfast.        Allergies: No Known Allergies  Family History: Family History  Problem Relation Age of Onset   Bladder Cancer Neg Hx    Kidney cancer Neg Hx     Social History:  reports that she has  never smoked. She has never used smokeless tobacco. She reports that she does not drink alcohol and does not use drugs.   Physical Exam: BP (!) 148/89   Pulse 79   Ht 5' 1 (1.549 m)   Wt 160 lb (72.6 kg)   BMI 30.23 kg/m   Constitutional:  Well nourished. Alert and oriented, No acute distress. HEENT:  [place] AT, moist mucus membranes.  Trachea midline Cardiovascular: No clubbing, cyanosis, or edema. Respiratory: Normal respiratory effort, no increased work of breathing. Neurologic: Grossly intact, no focal deficits, moving all 4 extremities. Psychiatric: Normal mood and affect.    Laboratory Data:  See EPIC and HPI I have reviewed the labs.    Pertinent Imaging: KUB, radiologist interpretation pending I have independently reviewed the films.  See HPI.    Assessment & Plan:    1.  Right renal stone - UA w/ micro heme - KUB ? Right stone over the sacral ala  - urine sent for culture in case right ureteral stone is present and we need to pursue definitive treatment    2.  Microscopic hematuria - UA w/ micro heme, may be secondary to stone or UTI - urine culture is pending   3. Frequency - UA w/ micro heme  - RUS is pending, if no ureteral stone is present, we will investigate other etiologies for her urinary frequency    Return in about 1 month (around 04/25/2024) for RUS report .   Jasmine Henderson   Jasmine Henderson [address] [place], [place] [address] [phone]

## 2024-03-25 ENCOUNTER — Ambulatory Visit (INDEPENDENT_AMBULATORY_CARE_PROVIDER_SITE_OTHER): Payer: Self-pay | Admitting: Urology

## 2024-03-25 ENCOUNTER — Ambulatory Visit
Admission: RE | Admit: 2024-03-25 | Discharge: 2024-03-25 | Disposition: A | Source: Ambulatory Visit | Attending: Urology | Admitting: Urology

## 2024-03-25 ENCOUNTER — Ambulatory Visit
Admission: RE | Admit: 2024-03-25 | Discharge: 2024-03-25 | Disposition: A | Discharge status: Home / Self Care | Source: Ambulatory Visit | Attending: Urology | Admitting: Urology

## 2024-03-25 VITALS — BP 148/89 | HR 79 | Ht 61.0 in | Wt 160.0 lb

## 2024-03-25 DIAGNOSIS — N201 Calculus of ureter: Secondary | ICD-10-CM

## 2024-03-25 DIAGNOSIS — N200 Calculus of kidney: Secondary | ICD-10-CM | POA: Diagnosis present

## 2024-03-25 DIAGNOSIS — R350 Frequency of micturition: Secondary | ICD-10-CM

## 2024-03-25 DIAGNOSIS — R3129 Other microscopic hematuria: Secondary | ICD-10-CM

## 2024-03-25 DIAGNOSIS — N1330 Unspecified hydronephrosis: Secondary | ICD-10-CM

## 2024-03-25 LAB — MICROSCOPIC EXAMINATION: Epithelial Cells (non renal): >10 /HPF — AB (ref 0–10)

## 2024-03-25 LAB — URINALYSIS, COMPLETE
Bilirubin, UA: NEGATIVE
Glucose, UA: NEGATIVE
Ketones, UA: NEGATIVE
Leukocytes,UA: NEGATIVE
Nitrite, UA: NEGATIVE
Protein,UA: NEGATIVE
Specific Gravity, UA: 1.01 (ref 1.005–1.030)
Urobilinogen, Ur: 0.2 mg/dL (ref 0.2–1.0)
pH, UA: 6 (ref 5.0–7.5)

## 2024-03-29 ENCOUNTER — Encounter: Payer: Self-pay | Admitting: Urology

## 2024-03-30 ENCOUNTER — Ambulatory Visit
Admission: RE | Admit: 2024-03-30 | Discharge: 2024-03-30 | Disposition: A | Discharge status: Home / Self Care | Source: Ambulatory Visit | Attending: Urology | Admitting: Urology

## 2024-03-30 DIAGNOSIS — N1330 Unspecified hydronephrosis: Secondary | ICD-10-CM | POA: Insufficient documentation

## 2024-03-31 ENCOUNTER — Other Ambulatory Visit: Payer: Self-pay

## 2024-03-31 ENCOUNTER — Ambulatory Visit: Payer: Self-pay | Admitting: Urology

## 2024-03-31 LAB — CULTURE, URINE COMPREHENSIVE

## 2024-03-31 MED ORDER — AMOXICILLIN-POT CLAVULANATE 875-125 MG PO TABS: 1.0000 | ORAL_TABLET | Freq: Two times a day (BID) | ORAL | 0 refills | Status: DC

## 2024-04-02 NOTE — Telephone Encounter (Signed)
 Patient called and I let them know the results and she understood

## 2024-04-20 NOTE — Progress Notes (Signed)
 04/26/24 1:06 PM   Jasmine Henderson 03-12-1976 [id number]  Referring provider:  Era Raisin, NP [address] [place],  [place] [address]  Urological history: 1. rUTI's -Contributing factors of age and vaginal atrophy -March 25, 2024, beta-hemolytic Streptococcus, group B -September 24, 2023 Beta hemolytic Streptococcus, group B -August 20, 2023 no growth -August 17, 2023, multiple species present  2.  Nephrolithiasis -Stone composition of 30% magnesium ammonium phosphate, 52% calcium phosphate, 3% calcium oxalate monohydrate and 15% ammonium urate -Right PCNL (2019) -Right URS (2019) -Right URS (2022)   3. Left ureterocele   Chief Complaint  Patient presents with   Right nephrolithiasis    HPI: Jasmine Henderson is a 48 y.o. woman who presents today for follow up with interpreter, Alan  Previous records reviewed.     She continues to have urinary frequency and urgency.  She also is having intermittent bilateral flank pain.  She states 1 flank will hurt on and off for a few days and then she will have discomfort in the other flank on and off for a few days.  She has not passed any other fragments since April.  She is having 8 or more daytime voids, nocturia x 1-2 with a severe urge to urinate.  She has stress urinary incontinence leaking 3 or more times a day.  She is sometimes using absorbent products for leakage.  She is wearing 1 pad a day.  She does not limit fluid intake.  And she does not engage in toilet mapping.  Patient denies any modifying or aggravating factors.  Patient denies any gross hematuria, dysuria or suprapubic pain. Patient denies any fevers, chills, nausea or vomiting.    RUS (03/2024) demonstrates resolution of the previous right-sided hydronephrosis on the ultrasound in April along with a left ureterocele.  UA yellow slightly cloudy, specific gravity 1.020, pH 7.0, 1+ heme, 0-5 WBCs, 3-10 RBCs, greater than 10 epithelial cells, and  more's sediments present, mucus threads present and a few bacteria.  She states she is not having her menses at this time.  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 unknown reason. NO treatment. 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   Thyroid disease     Surgical History: Past Surgical History:  Procedure Laterality Date   CARDIAC CATHETERIZATION     pt denies 06/01/21   CYSTOSCOPY/URETEROSCOPY/HOLMIUM LASER/STENT PLACEMENT Left 09/24/2017   Procedure: CYSTOSCOPY/URETEROSCOPY/left retrorade pyleogram;  Surgeon: Twylla Glendia BROCKS, MD;  Location: [facility];  Service: Urology;  Laterality: Left;   CYSTOSCOPY/URETEROSCOPY/HOLMIUM LASER/STENT PLACEMENT Right 12/18/2017   Procedure: CYSTOSCOPY/URETEROSCOPY/HOLMIUM LASER/STENT Exchange;  Surgeon: Penne Knee, MD;  Location: [facility];  Service: Urology;  Laterality: Right;   CYSTOSCOPY/URETEROSCOPY/HOLMIUM LASER/STENT PLACEMENT Right 06/08/2021   Procedure: CYSTOSCOPY/URETEROSCOPY/HOLMIUM LASER/STENT PLACEMENT;  Surgeon: Penne Knee, MD;  Location: [facility];  Service: Urology;  Laterality: Right;   IR NEPHROSTOMY PLACEMENT RIGHT  11/25/2017   NEPHROLITHOTOMY Right 11/25/2017   Procedure: NEPHROLITHOTOMY PERCUTANEOUS;  Surgeon: Penne Knee, MD;  Location: [facility];  Service: Urology;  Laterality: Right;    Home Medications:  Allergies as of 04/22/2024   No Known Allergies      Medication List        Accurate as of April 22, 2024 11:59 PM. If you have any questions, ask your nurse or doctor.          STOP taking these  medications    amoxicillin -clavulanate 875-125 MG tablet Commonly known as: AUGMENTIN  Stopped by: Hanya Guerin       TAKE these medications    amoxicillin  875 MG tablet Commonly known as: AMOXIL  Take 875 mg by mouth 2 (two) times daily.   hydrochlorothiazide 12.5 MG tablet Commonly known as: HYDRODIURIL Take 12.5 mg by mouth daily.   levothyroxine  100 MCG tablet Commonly known as: SYNTHROID  Take 100 mcg by mouth daily before breakfast.        Allergies: No Known Allergies  Family History: Family History  Problem Relation Age of Onset   Bladder Cancer Neg Hx    Kidney cancer Neg Hx     Social History:  reports that she has never smoked. She has never used smokeless tobacco. She reports that she does not drink alcohol and does not use drugs.   Physical Exam: BP 135/82   Pulse 72   Ht 5' 1 (1.549 m)   Wt 159 lb 8 oz (72.3 kg)   SpO2 97%   BMI 30.14 kg/m   Constitutional:  Well nourished. Alert and oriented, No acute distress. HEENT:  AT, moist mucus membranes.  Trachea midline Cardiovascular: No clubbing, cyanosis, or edema. Respiratory: Normal respiratory effort, no increased work of breathing. Neurologic: Grossly intact, no focal deficits, moving all 4 extremities. Psychiatric: Normal mood and affect.    Laboratory Data:  See EPIC and HPI I have reviewed the labs.    Pertinent Imaging: CLINICAL DATA:  right hydronephrosis   EXAM: RENAL / URINARY TRACT ULTRASOUND COMPLETE   COMPARISON:  September 17, 2023, August 17, 2023   FINDINGS: Right Kidney:   Renal measurements: 9.7 x 4.8 x 5.1 cm = volume: 125 mL. Echogenicity within normal limits. Resolution of previously described hydronephrosis. There is an echogenic focus along the cortex of the interpolar kidney which is favored to correspond to a focal cortical calcification with adjacent fat/scar on recent CT.   Left Kidney:   Renal measurements: 13.1 x 5.5 x 5.7 cm = volume: 214  mL. Echogenicity within normal limits. No mass or hydronephrosis visualized.   Bladder:   There is a curvilinear mobile area noted in along the site of the LEFT UVJ most consistent with a ureterocele.   Other:   None.   IMPRESSION: 1. Resolution of previously described right-sided hydronephrosis. 2. There is a curvilinear mobile area noted in along the site of the LEFT UVJ most consistent with a ureterocele.     Electronically Signed   By: Corean Salter M.D.   On: 03/31/2024 08:48 I have independently reviewed the films.  See HPI.    Assessment & Plan:    1.  Hydronephrosis of right kidney - Recent renal ultrasound has demonstrated the resolution of the right sided hydronephrosis  2.  Microscopic hematuria - UA w/ micro heme   3. Bilateral flank pain - Her recent renal ultrasound did not demonstrate any hydronephrosis, but in an abundance of caution given her past history of nephrolithiasis, we will go ahead and pursue a CT renal stone study to evaluate for her current stone burden - If CT renal stone study is negative and her urine cultures are  negative, the bilateral flank pain is likely muscle skeletal in etiology and then we will concentrate on her stress urinary incontinence and history of recurrent UTIs  4. Mixed incontinence/frequency  - CT renal stone study is pending, lower ureteral stones can sometimes cause urinary frequency and incontinence - If renal stone study is negative, we will need to evaluate further with pelvic exam to evaluate for pelvic floor strength or anatomical abnormalities that may contribute to incontinence or rUTI's  Return for I will call patient with results.   [name]   [facility] [address] [place], [place] [address] [phone]

## 2024-04-22 ENCOUNTER — Ambulatory Visit: Admitting: Urology

## 2024-04-22 ENCOUNTER — Encounter: Payer: Self-pay | Admitting: Urology

## 2024-04-22 VITALS — BP 135/82 | HR 72 | Ht 61.0 in | Wt 159.5 lb

## 2024-04-22 DIAGNOSIS — N1330 Unspecified hydronephrosis: Secondary | ICD-10-CM

## 2024-04-22 DIAGNOSIS — N200 Calculus of kidney: Secondary | ICD-10-CM | POA: Diagnosis not present

## 2024-04-22 DIAGNOSIS — R3129 Other microscopic hematuria: Secondary | ICD-10-CM | POA: Diagnosis not present

## 2024-04-22 DIAGNOSIS — R3915 Urgency of urination: Secondary | ICD-10-CM | POA: Diagnosis not present

## 2024-04-22 DIAGNOSIS — R10A3 Flank pain, bilateral: Secondary | ICD-10-CM

## 2024-04-22 LAB — URINALYSIS, COMPLETE
Bilirubin, UA: NEGATIVE
Glucose, UA: NEGATIVE
Ketones, UA: NEGATIVE
Leukocytes,UA: NEGATIVE
Nitrite, UA: NEGATIVE
Protein,UA: NEGATIVE
Specific Gravity, UA: 1.02 (ref 1.005–1.030)
Urobilinogen, Ur: 0.2 mg/dL (ref 0.2–1.0)
pH, UA: 7 (ref 5.0–7.5)

## 2024-04-22 LAB — MICROSCOPIC EXAMINATION: Epithelial Cells (non renal): >10 /HPF — AB (ref 0–10)

## 2024-04-22 LAB — BLADDER SCAN AMB NON-IMAGING

## 2024-04-22 NOTE — Patient Instructions (Signed)
 Call [phone] to schedule your appointment for CT Scan. We have sent your urine for further testing allow 3-7 days for us  to be able to review your results.

## 2024-04-28 ENCOUNTER — Ambulatory Visit: Payer: Self-pay | Admitting: Urology

## 2024-04-28 LAB — CULTURE, URINE COMPREHENSIVE

## 2024-05-04 ENCOUNTER — Ambulatory Visit
Admission: RE | Admit: 2024-05-04 | Discharge: 2024-05-04 | Disposition: A | Discharge status: Home / Self Care | Source: Ambulatory Visit | Attending: Urology | Admitting: Urology

## 2024-05-04 DIAGNOSIS — R3915 Urgency of urination: Secondary | ICD-10-CM | POA: Diagnosis present

## 2024-05-04 DIAGNOSIS — R10A3 Flank pain, bilateral: Secondary | ICD-10-CM | POA: Insufficient documentation

## 2024-05-04 DIAGNOSIS — R3129 Other microscopic hematuria: Secondary | ICD-10-CM | POA: Insufficient documentation

## 2024-05-08 ENCOUNTER — Other Ambulatory Visit: Payer: Self-pay | Admitting: Urology

## 2024-05-08 DIAGNOSIS — Q6231 Congenital ureterocele, orthotopic: Secondary | ICD-10-CM

## 2024-05-08 DIAGNOSIS — R3129 Other microscopic hematuria: Secondary | ICD-10-CM

## 2024-05-08 DIAGNOSIS — N200 Calculus of kidney: Secondary | ICD-10-CM

## 2024-05-08 NOTE — Progress Notes (Signed)
 Surgical Physician Order Form [facility] [place]  * Scheduling expectation : Patient prefers to have it done after January 3rd   *Length of Case:   *Clearance needed: no  *Anticoagulation Instructions: N/A  *Aspirin Instructions: N/A  *Post-op visit Date/Instructions:  TBD  *Diagnosis: Tubular area calcification within the superior pole/pelvis of the right kidney, left ureterocele   *Procedure: cystoscopy with diagnostic right Ureteroscopy w/laser lithotripsy & stent placement (47643) with possible biopsy  Additional orders: N/A  -Admit type: OUTpatient  -Anesthesia: General  -VTE Prophylaxis Standing Order SCD's       Other:   -Standing Lab Orders Per Anesthesia    Lab other: Urine Pregnancy, urinalysis and urine culture  -Standing Test orders EKG/Chest x-ray per Anesthesia       Test other:   - Medications:  Ancef  2gm IV  -Other orders:  N/A

## 2024-05-12 ENCOUNTER — Other Ambulatory Visit: Payer: Self-pay

## 2024-05-12 DIAGNOSIS — N200 Calculus of kidney: Secondary | ICD-10-CM

## 2024-05-12 DIAGNOSIS — Q6231 Congenital ureterocele, orthotopic: Secondary | ICD-10-CM

## 2024-05-12 DIAGNOSIS — N201 Calculus of ureter: Secondary | ICD-10-CM

## 2024-05-12 NOTE — Progress Notes (Signed)
 Surgical Physician Order Form [facility] [place]  Dr. Glendia Barba, MD  * Scheduling expectation : Patient prefers to have it done after January 3rd   *Length of Case:   *Clearance needed: no  *Anticoagulation Instructions: N/A  *Aspirin Instructions: N/A  *Post-op visit Date/Instructions:   TBD  *Diagnosis: Tubular area calcification within the superior pole/pelvis of the right kidney, left ureterocele   *Procedure: cystoscopy with diagnostic right Ureteroscopy w/laser lithotripsy & stent placement (47643) with possible biopsy  Additional orders: N/A  -Admit type: OUTpatient  -Anesthesia: General  -VTE Prophylaxis Standing Order SCD's       Other:   -Standing Lab Orders Per Anesthesia    Lab other: Urine Pregnancy, urinalysis and urine culture  -Standing Test orders EKG/Chest x-ray per Anesthesia       Test other:   - Medications:  Ancef  2gm IV  -Other orders:  N/A

## 2024-05-29 ENCOUNTER — Telehealth: Payer: Self-pay

## 2024-05-29 NOTE — Telephone Encounter (Signed)
 Per Dr. Twylla, Patient is to be scheduled for Cystoscopy with Diagnostic Right Ureteroscopy, Laser Lithotripsy and Stent Placement with possible Right Ureteral Biopsy   Mrs. Avilez-Martinez was contacted and possible surgical dates were discussed, Thursday January 15th, 2026 was agreed upon for surgery.   Patient was instructed that Dr. Twylla will require them to provide a pre-op UA & CX prior to surgery. This was ordered and scheduled drop off appointment was made for 06/25/2024.    Patient was directed to call [phone] between 1-3pm the day before surgery to find out surgical arrival time.  Instructions were given not to eat or drink from midnight on the night before surgery and have a driver for the day of surgery. On the surgery day patient was instructed to enter through the Medical Mall entrance of [facility] report the Same Day Surgery desk.   Pre-Admit Testing will be in contact via phone to set up an interview with the anesthesia team to review your history and medications prior to surgery.   Reminder of this information was sent via MyChart to the patient.

## 2024-05-29 NOTE — Progress Notes (Signed)
° °  [facility] Surgical Posting Form  Surgery Date: Date: 07/02/2024  Surgeon: Dr. Glendia Barba, MD  Inpt ( No  )   Outpt (Yes)   Obs ( No  )   Diagnosis: N20.1 Right Ureteral Stone, N20.0 Right Nephrolithiasis  -CPT: 52000, 52351, 52356, 47645  Surgery: Cystoscopy with Diagnostic Right Ureteroscopy, Laser Lithotripsy and Stent Placement with possible Right Ureteral Biopsy  Stop Anticoagulations: No  Cardiac/Medical/Pulmonary Clearance needed: no  *Orders entered into EPIC  Date: 05/29/2024   *Case booked in [place]  Date: 05/29/2024  *Notified pt of Surgery: Date: 05/29/2024  PRE-OP UA & CX: yes, will obtain in clinic on 06/25/2024  *Placed into Prior Authorization Work [place] Date: 05/29/2024  Assistant/laser/rep:No

## 2024-06-25 ENCOUNTER — Inpatient Hospital Stay
Admission: RE | Admit: 2024-06-25 | Discharge: 2024-06-25 | Disposition: A | Discharge status: Home / Self Care | Payer: Self-pay | Source: Ambulatory Visit

## 2024-06-25 ENCOUNTER — Other Ambulatory Visit

## 2024-06-25 NOTE — Progress Notes (Signed)
 TC to patient Jasmine Henderson to complete PAT interview, patient stated that her BP has been elevated and has been having headaches. She prefers to wait and get her BP under control before she can have this procedure. This nurse advise patient to call her doctor (PCP) and her surgeon to inform above. Patient ok with information given.

## 2024-06-25 NOTE — Patient Instructions (Signed)
 Your procedure is scheduled on: Thursday 07/02/24 Report to the Registration Desk on the 1st floor of the Medical Mall. To find out your arrival time, please call [phone] between 1PM - 3PM on: Wednesday 07/01/24 If your arrival time is 6:00 am, do not arrive before that time as the Medical Mall entrance doors do not open until 6:00 am.  REMEMBER: Instructions that are not followed completely may result in serious medical risk, up to and including death; or upon the discretion of your surgeon and anesthesiologist your surgery may need to be rescheduled.  Do not eat food or drink fluids after midnight the night before surgery.  No gum chewing or hard candies.  One week prior to surgery: Stop Anti-inflammatories (NSAIDS) such as Advil , Aleve, Ibuprofen , Motrin , Naproxen, Naprosyn and Aspirin based products such as Excedrin, Goody's Powder, BC Powder. Stop ANY OVER THE COUNTER supplements until after surgery.  You may however, continue to take Tylenol  if needed for pain up until the day of surgery.   Continue taking all of your other prescription medications up until the day of surgery.  ON THE DAY OF SURGERY ONLY TAKE THESE MEDICATIONS WITH SIPS OF WATER:  levothyroxine  (SYNTHROID , LEVOTHROID) 100 MCG    No Alcohol for 24 hours before or after surgery.  No Smoking including e-cigarettes for 24 hours before surgery.  No chewable tobacco products for at least 6 hours before surgery.  No nicotine patches on the day of surgery.  Do not use any recreational drugs for at least a week (preferably 2 weeks) before your surgery.  Please be advised that the combination of cocaine and anesthesia may have negative outcomes, up to and including death. If you test positive for cocaine, your surgery will be cancelled.  On the morning of surgery brush your teeth with toothpaste and water, you may rinse your mouth with mouthwash if you wish. Do not swallow any toothpaste or mouthwash.  Use  CHG Soap or wipes as directed on instruction sheet.  Do not wear jewelry, make-up, hairpins, clips or nail polish.  For welded (permanent) jewelry: bracelets, anklets, waist bands, etc.  Please have this removed prior to surgery.  If it is not removed, there is a chance that hospital personnel will need to cut it off on the day of surgery.  Do not wear lotions, powders, or perfumes.   Do not shave body hair from the neck down 48 hours before surgery.  Contact lenses, hearing aids and dentures may not be worn into surgery.  Do not bring valuables to the hospital. [facility] is not responsible for any missing/lost belongings or valuables.   Notify your doctor if there is any change in your medical condition (cold, fever, infection).  Wear comfortable clothing (specific to your surgery type) to the hospital.  After surgery, you can help prevent lung complications by doing breathing exercises.  Take deep breaths and cough every 1-2 hours. Your doctor may order a device called an Incentive Spirometer to help you take deep breaths. When coughing or sneezing, hold a pillow firmly against your incision with both hands. This is called splinting. Doing this helps protect your incision. It also decreases belly discomfort.  If you are being admitted to the hospital overnight, leave your suitcase in the car. After surgery it may be brought to your room.  In case of increased patient census, it may be necessary for you, the patient, to continue your postoperative care in the Same Day Surgery department.  If  you are being discharged the day of surgery, you will not be allowed to drive home. You will need a responsible individual to drive you home and stay with you for 24 hours after surgery.   If you are taking public transportation, you will need to have a responsible individual with you.  Please call the Pre-admissions Testing Dept. at [phone] if you have any questions about these  instructions.  Surgery Visitation Policy:  Patients having surgery or a procedure may have two visitors.  Children under the age of 42 must have an adult with them who is not the patient.  Inpatient Visitation:    Visiting hours are 7 a.m. to 8 p.m. Up to four visitors are allowed at one time in a patient room. The visitors may rotate out with other people during the day.  One visitor age 18 or older may stay with the patient overnight and must be in the room by 8 p.m.   Merchandiser, Retail to address health-related social needs:  [email or web address].proor.no  Su procedimiento est programado para: Jueves 07/02/24 Presntese en el mostrador de tax adviser del [employer] Para saber su hora de llegada, llame al [phone] entre la 1:00 p. m. y las 3:00 p. m. en: Miercoles 07/01/24 Si su hora de llegada es a las 6:00 am, no llegue antes de esa hora ya que las puertas de entrada del Medical Mall no se abren teacher, adult education las 6:00 am.  RECORDAR: Las instrucciones que no se siguen completamente pueden provocar riesgos mdicos graves, que pueden llegar hasta la muerte; o, segn el criterio de su cirujano y scientific laboratory technician, es posible que sea aeronautical engineer su leisure centre manager.  No ingiera alimentos ni bebidas despus de la medianoche del da anterior a la ciruga. No mascar chicle ni caramelos duros.  Una semana antes de la ciruga: Detenga los antiinflamatorios (AINE) como Advil , Aleve, Ibuprofeno, Motrin , Naproxen, Naprosyn y productos a base de aspirina como Excedrin, Goody's Powder, BC Powder. Suspenda CUALQUIER suplemento de venta libre hasta despus de la ciruga.  Sin embargo, puede educational psychologist tomando Tylenol  si es necesario para press photographer da de la ciruga. Contine tomando todos los medicamentos recetados, excepto los siguientes:   Siga las recomendaciones del cardilogo o PCP con respecto a suspender los anticoagulantes.  TOME SLO ESTOS  MEDICAMENTOS LA MAANA DE LA CIRUGA CON UN SORBO DE AGUA:  levothyroxine  (SYNTHROID , LEVOTHROID) 100 MCG   No consumir alcohol durante 24 horas antes o despus de la ciruga.  No fumar, incluidos los cigarrillos electrnicos, durante las 24 horas previas a la ciruga. No consumir productos de tabaco masticables durante al menos 6 horas antes de la ciruga. Sin parches de optometrist de la ciruga.  No use ningn medicamento recreativo durante al menos una semana (preferiblemente 2 semanas) antes de la ciruga. Tenga en cuenta que la combinacin de cocana y anestesia puede tener resultados negativos, que pueden llegar hasta la [place]. Si su prueba de cocana da positivo, su ciruga ser cancelada.  La maana de la ciruga cepille sus dientes con pasta dental y agua, puede enjuagarse la boca con enjuague bucal si lo desea. No ingiera pasta de dientes ni enjuague bucal.  Utilice jabn o toallitas CHG como se indica en la hoja de instrucciones.  No use joyas, maquillaje, horquillas, clips ni esmalte de uas.  No use lociones, polvos ni perfumes.  No se afeite el vello corporal desde el cuello hacia abajo 48 horas antes de  la ciruga.  No se pueden usar lentes de contacto, audfonos ni dentaduras postizas durante la ciruga.  No lleve objetos de valor al hospital. [facility] no es responsable de ninguna pertenencia u objeto de valor perdido o perdido. Artroplastia total de hombro: use gel de perxido de benzoilo al 5 % como se indica en la hoja de instrucciones.  Lleve su C-PAP al hospital en caso de que tenga que pasar la noche.  Notifique a su mdico si hay algn cambio en su condicin mdica (resfriado, fiebre, infeccin).  Lleve ropa cmoda (especfica para su tipo de ciruga) al hospital.  Despus de la ciruga, usted puede ayudar a prevenir complicaciones pulmonares haciendo ejercicios de respiracin. Respire profundamente y tosa cada 1 o 2 horas. Su mdico puede  indicarle un dispositivo llamado espirmetro incentivador para ayudarle a respirar profundamente. Al toser o engineering geologist, sostenga firmemente una almohada contra la incisin con ambas manos. Esto se llama ferulizacin. Hacer esto ayuda a proteger su incisin. Tambin disminuye las molestias abdominales.  Si vas a pasar la noche en el hospital, deja tu maleta en el coche. Despus de la ciruga, es posible que lo lleven a su habitacin.  En caso de un mayor censo de [place], puede ser necesario que usted, el [place], contine con su atencin posoperatoria en el departamento de Ciruga el Mismo Da.  Si le dan el alta el da de la [place], no se le permitir conducir a casa. Necesitar que una persona responsable lo lleve a su casa y se quede con usted durante las 24 horas posteriores a la ciruga.  Si viaja en transporte pblico, deber ir acompaado de una persona responsable.  Llame al Departamento de pruebas previas a la admisin al [phone] si tiene alguna pregunta sobre estas instrucciones.  Poltica de visitas a ciruga:  [employer] se someten a una ciruga o procedimiento pueden delphi familiares o personas de apoyo con ellos, siempre y cuando la persona no sea positiva para COVID-19 ni experimente sus sntomas.  Visitas para pacientes hospitalizados:  El horario de visita es de 7 a 20 horas. Se permiten hasta cuatro visitantes a la vez en la habitacin de un paciente. Los visitantes podrn rotar con garment/textile technologist. Ignacia persona de apoyo designada (adulto) podr pasar la noche.  Debido a un aumento en las tasas de VSR e influenza y las hospitalizaciones asociadas, los nios menores de 12 aos no podrn visitar a los pacientes en los hospitales de [employer]. Se siguen recomendando encarecidamente las mascarillas.

## 2024-06-30 ENCOUNTER — Telehealth: Payer: Self-pay

## 2024-06-30 NOTE — Telephone Encounter (Signed)
 Called patient with intertpreter today after receiving message from PAT that she wishes to cancel surgery. Pt. states that she has been having elevated blood pressures and feels like she needs to get this under control before having surgery. Explained to patient in detail the need for surgery, expressed she would follow up with PCP in the next weeks and will call us  back when she is ready to schedule surgery. Patient removed from OR grid for 07/02/24.

## 2024-07-02 ENCOUNTER — Encounter: Admission: RE | Payer: Self-pay | Source: Home / Self Care

## 2024-07-02 ENCOUNTER — Ambulatory Visit: Admission: RE | Admit: 2024-07-02 | Payer: Self-pay | Admitting: Urology

## 2024-07-02 SURGERY — CYSTOSCOPY/URETEROSCOPY/HOLMIUM LASER/STENT PLACEMENT
Anesthesia: General | Laterality: Right
